# Patient Record
Sex: Male | Born: 1950 | Hispanic: Yes | Marital: Single | Smoking: Current some day smoker
Health system: Southern US, Community
[De-identification: ages and names within clinical notes are randomized; demographics above are authoritative.]

## PROBLEM LIST (undated history)

## (undated) DIAGNOSIS — K219 Gastro-esophageal reflux disease without esophagitis: Secondary | ICD-10-CM

## (undated) DIAGNOSIS — M1990 Unspecified osteoarthritis, unspecified site: Secondary | ICD-10-CM

## (undated) HISTORY — PX: UMBILICAL HERNIA REPAIR: SHX196

---

## 1997-08-16 ENCOUNTER — Ambulatory Visit (HOSPITAL_COMMUNITY): Admission: RE | Admit: 1997-08-16 | Discharge: 1997-08-16 | Payer: Self-pay | Admitting: Gastroenterology

## 1997-08-28 ENCOUNTER — Ambulatory Visit (HOSPITAL_BASED_OUTPATIENT_CLINIC_OR_DEPARTMENT_OTHER): Admission: RE | Admit: 1997-08-28 | Discharge: 1997-08-28 | Payer: Self-pay | Admitting: General Surgery

## 2009-11-04 ENCOUNTER — Emergency Department (HOSPITAL_COMMUNITY): Admission: EM | Admit: 2009-11-04 | Discharge: 2009-11-04 | Payer: Self-pay | Admitting: Emergency Medicine

## 2010-04-25 LAB — URINE MICROSCOPIC-ADD ON

## 2010-04-25 LAB — URINALYSIS, ROUTINE W REFLEX MICROSCOPIC
Ketones, ur: NEGATIVE mg/dL
Leukocytes, UA: NEGATIVE
Nitrite: NEGATIVE
Specific Gravity, Urine: 1.019 (ref 1.005–1.030)
pH: 5.5 (ref 5.0–8.0)

## 2011-03-07 ENCOUNTER — Encounter: Payer: Self-pay | Admitting: Medical

## 2011-03-08 LAB — POCT CBC

## 2011-03-11 ENCOUNTER — Ambulatory Visit (INDEPENDENT_AMBULATORY_CARE_PROVIDER_SITE_OTHER): Payer: Managed Care, Other (non HMO) | Admitting: Medical

## 2011-03-11 ENCOUNTER — Encounter: Payer: Self-pay | Admitting: Medical

## 2011-03-11 VITALS — BP 130/90 | HR 72 | Temp 98.2°F | Resp 16 | Ht 63.5 in | Wt 161.0 lb

## 2011-03-11 DIAGNOSIS — R030 Elevated blood-pressure reading, without diagnosis of hypertension: Secondary | ICD-10-CM | POA: Insufficient documentation

## 2011-03-11 DIAGNOSIS — Z0000 Encounter for general adult medical examination without abnormal findings: Secondary | ICD-10-CM

## 2011-03-11 NOTE — Patient Instructions (Signed)
I saw you for a physical today.   Recommendations: Stop smoking Exercise regularly. Consider having a colonoscopy for cancer screening.  When you are ready for this let me know and we'll set this up. Please get me copies of your recent lab work.     Preventative Care for Adults, Male       REGULAR HEALTH EXAMS:  A routine yearly physical is a good way to check in with your primary care provider about your health and preventive screening. It is also an opportunity to share updates about your health and any concerns you have, and receive a thorough all-over exam.   Most health insurance companies pay for at least some preventative services.  Check with your health plan for specific coverages.  WHAT PREVENTATIVE SERVICES DO MEN NEED?  Adult men should have their weight and blood pressure checked regularly.   Men age 60 and older should have their cholesterol levels checked regularly.  Beginning at age 60 and continuing to age 90, men should be screened for colorectal cancer.  Certain people should may need continued testing until age 16.  Other cancer screening may include exams for testicular and prostate cancer.  Updating vaccinations is part of preventative care.  Vaccinations help protect against diseases such as the flu.  Lab tests are generally done as part of preventative care to screen for anemia and blood disorders, to screen for problems with the kidneys and liver, to screen for bladder problems, to check blood sugar, and to check your cholesterol level.  Preventative services generally include counseling about diet, exercise, avoiding tobacco, drugs, excessive alcohol consumption, and sexually transmitted infections.    GENERAL RECOMMENDATIONS FOR GOOD HEALTH:  Healthy diet:  Eat a variety of foods, including fruit, vegetables, animal or vegetable protein, such as meat, fish, chicken, and eggs, or beans, lentils, tofu, and grains, such as rice.  Drink plenty of water  daily.  Decrease saturated fat in the diet, avoid lots of red meat, processed foods, sweets, fast foods, and fried foods.  Exercise:  Aerobic exercise helps maintain good heart health. At least 30-40 minutes of moderate-intensity exercise is recommended. For example, a brisk walk that increases your heart rate and breathing. This should be done on most days of the week.   Find a type of exercise or a variety of exercises that you enjoy so that it becomes a part of your daily life.  Examples are running, walking, swimming, water aerobics, and biking.  For motivation and support, explore group exercise such as aerobic class, spin class, Zumba, Yoga,or  martial arts, etc.    Set exercise goals for yourself, such as a certain weight goal, walk or run in a race such as a 5k walk/run.  Speak to your primary care provider about exercise goals.  Disease prevention:  If you smoke or chew tobacco, find out from your caregiver how to quit. It can literally save your life, no matter how long you have been a tobacco user. If you do not use tobacco, never begin.   Maintain a healthy diet and normal weight. Increased weight leads to problems with blood pressure and diabetes.   The Body Mass Index or BMI is a way of measuring how much of your body is fat. Having a BMI above 27 increases the risk of heart disease, diabetes, hypertension, stroke and other problems related to obesity. Your caregiver can help determine your BMI and based on it develop an exercise and dietary program to help  you achieve or maintain this important measurement at a healthful level.  High blood pressure causes heart and blood vessel problems.  Persistent high blood pressure should be treated with medicine if weight loss and exercise do not work.   Fat and cholesterol leaves deposits in your arteries that can block them. This causes heart disease and vessel disease elsewhere in your body.  If your cholesterol is found to be high, or if  you have heart disease or certain other medical conditions, then you may need to have your cholesterol monitored frequently and be treated with medication.   Ask if you should have a stress test if your history suggests this. A stress test is a test done on a treadmill that looks for heart disease. This test can find disease prior to there being a problem.  Avoid drinking alcohol in excess (more than two drinks per day).  Avoid use of street drugs. Do not share needles with anyone. Ask for professional help if you need assistance or instructions on stopping the use of alcohol, cigarettes, and/or drugs.  Brush your teeth twice a day with fluoride toothpaste, and floss once a day. Good oral hygiene prevents tooth decay and gum disease. The problems can be painful, unattractive, and can cause other health problems. Visit your dentist for a routine oral and dental check up and preventive care every 6-12 months.   Look at your skin regularly.  Use a mirror to look at your back. Notify your caregivers of changes in moles, especially if there are changes in shapes, colors, a size larger than a pencil eraser, an irregular border, or development of new moles.  Safety:  Use seatbelts 100% of the time, whether driving or as a passenger.  Use safety devices such as hearing protection if you work in environments with loud noise or significant background noise.  Use safety glasses when doing any work that could send debris in to the eyes.  Use a helmet if you ride a bike or motorcycle.  Use appropriate safety gear for contact sports.  Talk to your caregiver about gun safety.  Use sunscreen with a SPF (or skin protection factor) of 15 or greater.  Lighter skinned people are at a greater risk of skin cancer. Don't forget to also wear sunglasses in order to protect your eyes from too much damaging sunlight. Damaging sunlight can accelerate cataract formation.   Practice safe sex. Use condoms. Condoms are used for  birth control and to help reduce the spread of sexually transmitted infections (or STIs).  Some of the STIs are gonorrhea (the clap), chlamydia, syphilis, trichomonas, herpes, HPV (human papilloma virus) and HIV (human immunodeficiency virus) which causes AIDS. The herpes, HIV and HPV are viral illnesses that have no cure. These can result in disability, cancer and death.   Keep carbon monoxide and smoke detectors in your home functioning at all times. Change the batteries every 6 months or use a model that plugs into the wall.   Vaccinations:  Stay up to date with your tetanus shots and other required immunizations. You should have a booster for tetanus every 10 years. Be sure to get your flu shot every year, since 5%-20% of the [place] population comes down with the flu. The flu vaccine changes each year, so being vaccinated once is not enough. Get your shot in the fall, before the flu season peaks.   Other vaccines to consider:  Pneumococcal vaccine to protect against certain types of pneumonia.  This is  normally recommended for adults age 53 or older.  However, adults younger than 60 years old with certain underlying conditions such as diabetes, heart or lung disease should also receive the vaccine.  Shingles vaccine to protect against Varicella Zoster if you are older than age 105, or younger than 60 years old with certain underlying illness.  Hepatitis A vaccine to protect against a form of infection of the liver by a virus acquired from food.  Hepatitis B vaccine to protect against a form of infection of the liver by a virus acquired from blood or body fluids, particularly if you work in health care.  If you plan to travel internationally, check with your local health department for specific vaccination recommendations.  Cancer Screening:  Most routine colon cancer screening begins at the age of 63. On a yearly basis, doctors may provide special easy to use take-home tests to check for hidden  blood in the stool. Sigmoidoscopy or colonoscopy can detect the earliest forms of colon cancer and is life saving. These tests use a small camera at the end of a tube to directly examine the colon. Speak to your caregiver about this at age 11, when routine screening begins (and is repeated every 5 years unless early forms of pre-cancerous polyps or small growths are found).   At the age of 8 men usually start screening for prostate cancer every year. Screening may begin at a younger age for those with higher risk. Those at higher risk include African-Americans or having a family history of prostate cancer. There are two types of tests for prostate cancer:   Prostate-specific antigen (PSA) testing. Recent studies raise questions about prostate cancer using PSA and you should discuss this with your caregiver.   Digital rectal exam (in which your doctor's lubricated and gloved finger feels for enlargement of the prostate through the anus).   Screening for testicular cancer.  Do a monthly exam of your testicles. Gently roll each testicle between your thumb and fingers, feeling for any abnormal lumps. The best time to do this is after a hot shower or bath when the tissues are looser. Notify your caregivers of any lumps, tenderness or changes in size or shape immediately.

## 2011-03-11 NOTE — Progress Notes (Signed)
Subjective:   HPI  Jose Howell is a 61 y.o. male who presents for a complete physical.  He is a new patient today.  Originally from [place].  He hasn't had a primary doctor.  He is here for employer to have physical for insurance purposes.  He has no specific c/o.   He notes colonoscopy 15 years ago normal.  He does smoke some.  He use to smoke and drink more.   Otherwise been in usual state of health. Reviewed their medical, surgical, family, social, medication, and allergy history and updated chart as appropriate.   History reviewed. No pertinent past medical history.  Past Surgical History  Procedure Date  . Umbilical hernia repair     Family History  Problem Relation Age of Onset  . Cancer Neg Hx   . Diabetes Neg Hx   . Heart disease Neg Hx   . Hyperlipidemia Neg Hx   . Hypertension Neg Hx   . Stroke Neg Hx     History   Social History  . Marital Status: Single    Spouse Name: N/A    Number of Children: N/A  . Years of Education: N/A   Occupational History  . Financial controller   Social History Main Topics  . Smoking status: Current Some Day Smoker    Types: Cigarettes  . Smokeless tobacco: Not on file  . Alcohol Use: 0.5 oz/week    1 drink(s) per week  . Drug Use: No  . Sexually Active: Not on file   Other Topics Concern  . Not on file   Social History Narrative   From [place] originally, works as Artist at [employer], single, no children, exercise on the job    No current outpatient prescriptions on file prior to visit.    No Known Allergies  Review of Systems Constitutional: -fever, -chills, -sweats, -unexpected weight change, -anorexia, -fatigue Allergy: -sneezing, -itching, -congestion Dermatology: denies changing moles, rash, lumps, new worrisome lesions ENT: -runny nose, -ear pain, -sore throat, -hoarseness, -sinus pain, -teeth pain, -tinnitus, -hearing loss, -epistaxis Cardiology:  -chest pain, -palpitations, -edema,  -orthopnea, -paroxysmal nocturnal dyspnea Respiratory: -cough, -shortness of breath, -dyspnea on exertion, -wheezing, -hemoptysis Gastroenterology: -abdominal pain, -nausea, -vomiting, -diarrhea, -constipation, -blood in stool, -changes in bowel movement, -dysphagia Hematology: -bleeding or bruising problems Musculoskeletal: -arthralgias, -myalgias, -joint swelling, -back pain, -neck pain, -cramping, -gait changes Ophthalmology: -vision changes, -eye redness, -itching, -discharge Urology: -dysuria, -difficulty urinating, -hematuria, -urinary frequency, -urgency, incontinence Neurology: -headache, -weakness, -tingling, -numbness, -speech abnormality, -memory loss, -falls, -dizziness Psychology:  -depressed mood, -agitation, -sleep problems     Objective:   Physical Exam  Filed Vitals:   03/11/11 1544  BP: 130/90  Pulse: 72  Temp: 98.2 F (36.8 C)  Resp: 16    General appearance: alert, no distress, WD/WN, male Skin: no obvious worrisome lesions HEENT: normocephalic, conjunctiva/corneas normal, sclerae anicteric, PERRLA, EOMi, nares patent, no discharge or erythema, pharynx normal Oral cavity: MMM, tongue normal, teeth in good repair Neck: supple, no lymphadenopathy, no thyromegaly, no masses, normal ROM, no bruits Chest: non tender, normal shape and expansion Heart: RRR, normal S1, S2, no murmurs Lungs: CTA bilaterally, no wheezes, rhonchi, or rales Abdomen: +bs, soft, non tender, non distended, no masses, no hepatomegaly, no splenomegaly, no bruits Back: non tender, normal ROM, no scoliosis Musculoskeletal: upper extremities non tender, no obvious deformity, normal ROM throughout, lower extremities non tender, no obvious deformity, normal ROM throughout Extremities: no edema, no cyanosis, no clubbing Pulses: 2+  symmetric, upper and lower extremities, normal cap refill Neurological: alert, oriented x 3, CN2-12 intact, strength normal upper extremities and lower extremities,  sensation normal throughout, DTRs 2+ throughout, no cerebellar signs, gait normal Psychiatric: normal affect, behavior normal, pleasant  GU: normal male external genitalia, nontender, no masses, no hernia, no lymphadenopathy Rectal: anus normal appearing, no obvious lesions or hemorrhoids, prostate WNL   Assessment and Plan :    Encounter Diagnoses  Name Primary?  . Routine general medical examination at a health care facility Yes  . Elevated blood-pressure reading without diagnosis of hypertension     Physical exam - discussed healthy lifestyle, diet, exercise, preventative care, vaccinations, and addressed their concerns.    Advised he return in 67mo nurse visit for BP check.   Advised him to bring labs back from where he had labs 3 days ago through employer.    He plans to call back in 67mo for referral to colonoscopy.

## 2011-04-16 ENCOUNTER — Encounter: Payer: Self-pay | Admitting: Medical

## 2011-04-16 ENCOUNTER — Ambulatory Visit (INDEPENDENT_AMBULATORY_CARE_PROVIDER_SITE_OTHER): Payer: Managed Care, Other (non HMO) | Admitting: Medical

## 2011-04-16 VITALS — BP 122/88 | HR 100 | Temp 98.1°F | Resp 16 | Wt 163.0 lb

## 2011-04-16 DIAGNOSIS — R21 Rash and other nonspecific skin eruption: Secondary | ICD-10-CM

## 2011-04-16 DIAGNOSIS — R002 Palpitations: Secondary | ICD-10-CM

## 2011-04-16 DIAGNOSIS — R238 Other skin changes: Secondary | ICD-10-CM

## 2011-04-16 DIAGNOSIS — M25511 Pain in right shoulder: Secondary | ICD-10-CM

## 2011-04-16 DIAGNOSIS — Z1211 Encounter for screening for malignant neoplasm of colon: Secondary | ICD-10-CM

## 2011-04-16 DIAGNOSIS — M25519 Pain in unspecified shoulder: Secondary | ICD-10-CM

## 2011-04-16 DIAGNOSIS — R631 Polydipsia: Secondary | ICD-10-CM

## 2011-04-16 DIAGNOSIS — L853 Xerosis cutis: Secondary | ICD-10-CM

## 2011-04-16 LAB — POCT URINALYSIS DIPSTICK
Protein, UA: NEGATIVE
Spec Grav, UA: 1.02
Urobilinogen, UA: NEGATIVE
pH, UA: 5

## 2011-04-16 LAB — HEMOGLOBIN A1C: Hgb A1c MFr Bld: 5.7 % — ABNORMAL HIGH (ref <5.7)

## 2011-04-16 MED ORDER — CLOBETASOL PROPIONATE 0.05 % EX CREA: TOPICAL_CREAM | Freq: Two times a day (BID) | CUTANEOUS | Status: DC

## 2011-04-16 NOTE — Patient Instructions (Addendum)
Elevated triglycerides and cholesterol - you can begin the over the counter Niacin, Fish Oil and Red Yeast Rice.  Avoid lots of red meat.  We can recheck the cholesterol again in 3 months.  We will check blood work today for liver, kidney, electrolytes, and diabetes.   We will refer you to Dr. Elnoria Howard for your first screening colonoscopy to screen for colon cancer.  Use daily moisturizing lotion such as Lubriderm lotion for dry skin.  I sent a strong steroid cream to your pharmacy, Clobetasol, to help with the arm rash. If not resolving, let me know.    Shoulder pain - you likely have rotator cuff issue, some adhesive capsulitis and possible arthritis.  I would recommend you take some OTC Aleve once to twice daily, but I also would recommend referral to either physical therapy or orthopedic.  Let me know which you want to do.   We did an EKG today to look at your heart rhythm.  It looks normal.  This isn't the only way to check the heart though.  Other ways to check the heart include ultrasound and stress testing.  Your risk factors for heart disease include tobacco use, age, and elevated cholesterol.  Currently you don't have any ssymptomsworrisome for heart problems, but if you want additional testing, we could refer you for a stress test for screening purposes.

## 2011-04-16 NOTE — Progress Notes (Signed)
Subjective:   HPI  Jose Howell is a 61 y.o. male who presents for multiple concerns.  1) wants referral for colonoscopy.  He notes EGD years ago, but has never had a colonoscopy. 2) last 4-5 days with itchy red rash on his arms. No new exposures.  No prior similar rash.  Using coconut soap in general for hygiene that he has used for years.   3) he brought by labs he had done at work.  Wants additional labs to screen for diabetes.  Wants his heart checked out.   4) he had elevated lipids on labs and wants my opinion about taking OTC fish oil, red yeast rice and niacin.   5) he has chronic shoulder problems.  Parachuted in the military years ago, has had pain since.   In the past has had steroid injection in the shoulders.  Has limited ROM.  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    No other c/o.  The following portions of the patient's history were reviewed and updated as appropriate: allergies, current medications, past family history, past medical history, past social history, past surgical history and problem list.  No past medical history on file.  No Known Allergies   Review of Systems ROS reviewed and was negative other than noted in HPI or above.    Objective:   Physical Exam  General appearance: alert, no distress, WD/WN Skin: bilat upper and lower arms with erythema, some scaling, also dry skin Oral cavity: MMM, no lesions Neck: supple, no lymphadenopathy, no thyromegaly, no masses, no bruits Heart: RRR, normal S1, S2, no murmurs Lungs: CTA bilaterally, no wheezes, rhonchi, or rales MSK: nontender shoulders to palpation, but bilat ROM reduced in all planes - flexion, extension, internal and external rotation, pain with overhead motion, empty can test, mildly positive apprehension sign, negative crossover test, otherwise UE unremarkable Abdomen: +bs, soft, non tender, non distended, no masses, no hepatomegaly, no splenomegaly Pulses: 2+  symmetric, upper and lower extremities, normal cap refill Neuro: normal strength, sensation and DTRs of UE   Adult ECG Report  Indication: palpitations  Rate: 85bpm  Rhythm: normal sinus rhythm  QRS Axis: 49 degrees  PR Interval:  QRS Duration: 86ms  QTc:  Conduction Disturbances: none  Other Abnormalities: none  Patient's cardiac risk factors are: advanced age (older than 77 for men, 41 for women), dyslipidemia, male gender and smoking/ tobacco exposure.  EKG comparison: none  Narrative Interpretation: no acute changes    Assessment and Plan :     Encounter Diagnoses  Name Primary?  . Rash and nonspecific skin eruption Yes  . Palpitation   . Shoulder pain, bilateral   . Screen for colon cancer   . Polydipsia   . Dry skin    Rash - dry skin, inflammatory, begin lotion and Clobetasol short term  Palpitation - EKG today normal.  We discussed risk factors, additional screening with treadmill test if desired.  He will let me know.  Shoulder pain - consider PT vs ortho referral.  Differential includes adhesive capsulitis, RTC, and arthritis.  He will let me know about referral  Will refer to Dr. Elnoria Howard for first screening colonoscopy  Polydipsia - additional labs today.   Reviewed his recent CBC and lipid panel.  Dry skin - daily lubriderm recommended

## 2011-04-17 LAB — COMPREHENSIVE METABOLIC PANEL
ALT: 25 U/L (ref 0–53)
Albumin: 4.4 g/dL (ref 3.5–5.2)
BUN: 13 mg/dL (ref 6–23)
CO2: 21 mEq/L (ref 19–32)
Calcium: 8.9 mg/dL (ref 8.4–10.5)
Chloride: 106 mEq/L (ref 96–112)
Creat: 0.86 mg/dL (ref 0.50–1.35)
Potassium: 4.1 mEq/L (ref 3.5–5.3)

## 2011-04-17 LAB — C-REACTIVE PROTEIN: CRP: 0.2 mg/dL (ref <0.60)

## 2012-01-29 ENCOUNTER — Other Ambulatory Visit: Payer: Self-pay

## 2012-01-29 ENCOUNTER — Emergency Department (HOSPITAL_COMMUNITY): Payer: Self-pay

## 2012-01-29 ENCOUNTER — Encounter (HOSPITAL_COMMUNITY): Payer: Self-pay

## 2012-01-29 ENCOUNTER — Observation Stay (HOSPITAL_COMMUNITY)
Admission: EM | Admit: 2012-01-29 | Discharge: 2012-01-29 | Discharge status: Against Medical Advice | Payer: Self-pay | Attending: Emergency Medicine | Admitting: Emergency Medicine

## 2012-01-29 DIAGNOSIS — R51 Headache: Secondary | ICD-10-CM | POA: Insufficient documentation

## 2012-01-29 DIAGNOSIS — R0789 Other chest pain: Principal | ICD-10-CM | POA: Insufficient documentation

## 2012-01-29 DIAGNOSIS — M79609 Pain in unspecified limb: Secondary | ICD-10-CM | POA: Insufficient documentation

## 2012-01-29 DIAGNOSIS — Z79899 Other long term (current) drug therapy: Secondary | ICD-10-CM | POA: Insufficient documentation

## 2012-01-29 DIAGNOSIS — R42 Dizziness and giddiness: Secondary | ICD-10-CM | POA: Insufficient documentation

## 2012-01-29 DIAGNOSIS — F17200 Nicotine dependence, unspecified, uncomplicated: Secondary | ICD-10-CM | POA: Insufficient documentation

## 2012-01-29 DIAGNOSIS — R079 Chest pain, unspecified: Secondary | ICD-10-CM

## 2012-01-29 DIAGNOSIS — Z7982 Long term (current) use of aspirin: Secondary | ICD-10-CM | POA: Insufficient documentation

## 2012-01-29 LAB — COMPREHENSIVE METABOLIC PANEL
ALT: 21 U/L (ref 0–53)
Albumin: 4 g/dL (ref 3.5–5.2)
Alkaline Phosphatase: 61 U/L (ref 39–117)
BUN: 9 mg/dL (ref 6–23)
Chloride: 103 mEq/L (ref 96–112)
GFR calc Af Amer: >90 mL/min (ref >90)
Glucose, Bld: 109 mg/dL — ABNORMAL HIGH (ref 70–99)
Potassium: 4 mEq/L (ref 3.5–5.1)
Sodium: 139 mEq/L (ref 135–145)
Total Bilirubin: 0.3 mg/dL (ref 0.3–1.2)
Total Protein: 7.4 g/dL (ref 6.0–8.3)

## 2012-01-29 LAB — CBC WITH DIFFERENTIAL/PLATELET
Eosinophils Absolute: 0 10*3/uL (ref 0.0–0.7)
Hemoglobin: 16.1 g/dL (ref 13.0–17.0)
Lymphs Abs: 1.7 10*3/uL (ref 0.7–4.0)
Monocytes Relative: 7 % (ref 3–12)
Neutro Abs: 4 10*3/uL (ref 1.7–7.7)
Neutrophils Relative %: 65 % (ref 43–77)
Platelets: 241 10*3/uL (ref 150–400)
RBC: 4.96 MIL/uL (ref 4.22–5.81)
WBC: 6.2 10*3/uL (ref 4.0–10.5)

## 2012-01-29 LAB — POCT I-STAT TROPONIN I: Troponin i, poc: 0.01 ng/mL (ref 0.00–0.08)

## 2012-01-29 MED ORDER — NITROGLYCERIN 0.4 MG SL SUBL
0.4000 mg | SUBLINGUAL_TABLET | SUBLINGUAL | Status: DC | PRN
  Administered 2012-01-29: 0.4 mg via SUBLINGUAL

## 2012-01-29 MED ORDER — ASPIRIN 81 MG PO CHEW
324.0000 mg | CHEWABLE_TABLET | Freq: Once | ORAL | Status: AC
  Administered 2012-01-29: 324 mg via ORAL
  Filled 2012-01-29: qty 4

## 2012-01-29 NOTE — ED Provider Notes (Addendum)
History     CSN: [id number]  Arrival date & time 01/29/12  1501   First MD Initiated Contact with Patient 01/29/12 1713      Chief Complaint  Patient presents with  . Chest Pain    (Consider location/radiation/quality/duration/timing/severity/associated sxs/prior treatment) HPI  Patient complaining of left upper arm pain and left chest pain began yesterday about 2-3 minutes.  Today still had left upper arm pain then began having sharp chest pain and felt dizzy about noon today doing housework, felt sob walking in.  No similar episode.  Mild headache, no cough, nausea, vomiting or diarrhea.  Patient is smoker, some question of hyperlipidemia, no family h.o. Of cad.  Patient states no chest pain at present but left upper arm pain at 7-8 /10.    History reviewed. No pertinent past medical history.  Past Surgical History  Procedure Date  . Umbilical hernia repair     Family History  Problem Relation Age of Onset  . Cancer Neg Hx   . Diabetes Neg Hx   . Heart disease Neg Hx   . Hyperlipidemia Neg Hx   . Hypertension Neg Hx   . Stroke Neg Hx     History  Substance Use Topics  . Smoking status: Current Some Day Smoker    Types: Cigarettes  . Smokeless tobacco: Not on file  . Alcohol Use: 0.5 oz/week    1 drink(s) per week      Review of Systems  Constitutional: Negative for fever and chills.  HENT: Negative for neck stiffness.   Eyes: Negative for visual disturbance.  Respiratory: Negative for shortness of breath.   Cardiovascular: Negative for chest pain.  Gastrointestinal: Negative for vomiting, diarrhea and blood in stool.  Genitourinary: Negative for dysuria, frequency and decreased urine volume.  Musculoskeletal: Negative for myalgias and joint swelling.  Skin: Negative for rash.  Neurological: Negative for weakness.  Hematological: Negative for adenopathy.  Psychiatric/Behavioral: Negative for agitation.    Allergies  Review of patient's allergies  indicates no known allergies.  Home Medications   Current Outpatient Rx  Name  Route  Sig  Dispense  Refill  . ASPIRIN 325 MG PO TABS   Oral   Take 325 mg by mouth daily.         Marland Kitchen GARLIC 1000 MG PO CAPS   Oral   Take 1 capsule by mouth daily.         Marland Kitchen ONE-DAILY MULTI VITAMINS PO TABS   Oral   Take 1 tablet by mouth daily.           BP 142/98  Pulse 103  Temp 98.4 F (36.9 C) (Oral)  Resp 26  SpO2 97%  Physical Exam  Nursing note and vitals reviewed. Constitutional: He is oriented to person, place, and time. He appears well-developed and well-nourished.  HENT:  Head: Normocephalic and atraumatic.  Right Ear: External ear normal.  Left Ear: External ear normal.  Nose: Nose normal.  Mouth/Throat: Oropharynx is clear and moist.  Eyes: Conjunctivae normal and EOM are normal. Pupils are equal, round, and reactive to light.  Neck: Normal range of motion. Neck supple.  Cardiovascular: Normal rate, regular rhythm, normal heart sounds and intact distal pulses.   Pulmonary/Chest: Effort normal and breath sounds normal. No respiratory distress. He has no wheezes. He exhibits no tenderness.  Abdominal: Soft. Bowel sounds are normal. He exhibits no distension and no mass. There is no tenderness. There is no guarding.  Musculoskeletal: Normal range of  motion.  Neurological: He is alert and oriented to person, place, and time. He has normal reflexes. He exhibits normal muscle tone. Coordination normal.  Skin: Skin is warm and dry.  Psychiatric: He has a normal mood and affect. His behavior is normal. Judgment and thought content normal.    ED Course  Procedures (including critical care time)  Labs Reviewed  COMPREHENSIVE METABOLIC PANEL - Abnormal; Notable for the following:    Glucose, Bld 109 (*)     All other components within normal limits  CBC WITH DIFFERENTIAL  POCT I-STAT TROPONIN I   Dg Chest 2 View  01/29/2012  *RADIOLOGY REPORT*  Clinical Data: Left-sided  chest pain  CHEST - 2 VIEW  Comparison: 11/04/2009  Findings: Cardiomediastinal silhouette is unremarkable.  No acute infiltrate or pleural effusion.  No pulmonary edema.  Degenerative changes thoracic spine.  IMPRESSION: No active disease.   Original Report Authenticated By: Natasha Mead, M.D.      No diagnosis found.    Date: 01/29/2012  Rate: 104  Rhythm: sinus tachycardia  QRS Axis: normal  Intervals: normal  ST/T Wave abnormalities: normal  Conduction Disutrbances:none  Narrative Interpretation:   Old EKG Reviewed: none available   MDM   Patient with atypical chest pain with lue pain.  Negative work up here with no ekg changes.  Plan chest pain work up in cdu.  Discussed with Felicie Morn, np and will transfer to cdu and plan ct angio in a.m.        Jose Quarry, MD 01/29/12 1958  Jose Quarry, MD 01/29/12 2000  Specimen with patient and he states he cannot stay in hospital do to his animal care situation. I've advised the patient that he should be evaluated here tonight. He is choosing to leave AGAINST MEDICAL ADVICE. Date: 01/29/2012 Patient: Jose Howell Admitted: 01/29/2012  5:02 PM Attending Provider: Hilario Quarry, MD  Janett Billow or his authorized caregiver has made the decision for the patient to leave the emergency department against the advice of Jose Quarry, MD.  He or his authorized caregiver has been informed and understands the inherent risks, including death.  He or his authorized caregiver has decided to accept the responsibility for this decision. Jose Howell and all necessary parties have been advised that he may return for further evaluation or treatment. His condition at time of discharge was Stable.  Jose Howell had current vital signs as follows:  Blood pressure 144/71, pulse 93, temperature 98.4 F (36.9 C), temperature source Oral, resp. rate 21, SpO2 98.00%.   Jose Howell or his authorized caregiver has  signed the Leaving Against Medical Advice form prior to leaving the department.  Azriella Mattia S 01/29/2012      Jose Quarry, MD 01/29/12 2005

## 2012-01-29 NOTE — ED Notes (Signed)
Pt presents with L sided chest pain since yesterday while doing the dishes.  Pt reports pain radiates into L arm.  +shortness of breath and dizziness.

## 2012-01-29 NOTE — ED Notes (Signed)
Patient is leaving AMA after having discussed treatment and consequences of leaving without finishing his evaluation and treatment.

## 2012-01-29 NOTE — ED Notes (Signed)
Patient arrived by POV, drove himself. He states he started having chest discomfort with dizziness with some blurry vision yesterday. Today he continued to have chest discomfort with radiation to left arm. Denies diaphoresis or nausea.

## 2012-01-29 NOTE — ED Notes (Signed)
Patient denies chest discomfort at this time. Just has dizziness and left arm tingling with numbness.

## 2014-08-11 ENCOUNTER — Emergency Department (HOSPITAL_COMMUNITY)
Admission: EM | Admit: 2014-08-11 | Discharge: 2014-08-11 | Discharge status: Against Medical Advice | Payer: Non-veteran care | Attending: Emergency Medicine | Admitting: Emergency Medicine

## 2014-08-11 ENCOUNTER — Encounter (HOSPITAL_COMMUNITY): Payer: Self-pay | Admitting: Emergency Medicine

## 2014-08-11 ENCOUNTER — Emergency Department (HOSPITAL_COMMUNITY): Payer: Non-veteran care

## 2014-08-11 ENCOUNTER — Ambulatory Visit (HOSPITAL_COMMUNITY): Payer: Managed Care, Other (non HMO)

## 2014-08-11 DIAGNOSIS — R42 Dizziness and giddiness: Secondary | ICD-10-CM | POA: Diagnosis present

## 2014-08-11 DIAGNOSIS — Z79899 Other long term (current) drug therapy: Secondary | ICD-10-CM | POA: Insufficient documentation

## 2014-08-11 DIAGNOSIS — Z720 Tobacco use: Secondary | ICD-10-CM | POA: Diagnosis not present

## 2014-08-11 DIAGNOSIS — M25511 Pain in right shoulder: Secondary | ICD-10-CM | POA: Diagnosis not present

## 2014-08-11 DIAGNOSIS — Z7982 Long term (current) use of aspirin: Secondary | ICD-10-CM | POA: Diagnosis not present

## 2014-08-11 MED ORDER — LORAZEPAM 1 MG PO TABS
1.0000 mg | ORAL_TABLET | Freq: Once | ORAL | Status: AC
  Administered 2014-08-11: 1 mg via ORAL
  Filled 2014-08-11: qty 1

## 2014-08-11 MED ORDER — MECLIZINE HCL 25 MG PO TABS
25.0000 mg | ORAL_TABLET | Freq: Once | ORAL | Status: AC
  Administered 2014-08-11: 25 mg via ORAL
  Filled 2014-08-11: qty 1

## 2014-08-11 MED ORDER — MECLIZINE HCL 50 MG PO TABS: 25.0000 mg | ORAL_TABLET | Freq: Three times a day (TID) | ORAL | Status: DC | PRN

## 2014-08-11 NOTE — Discharge Instructions (Signed)
°Emergency Department Resource Guide °1) Find a Doctor and Pay Out of Pocket °Although you won't have to find out who is covered by your insurance plan, it is a good idea to ask around and get recommendations. You will then need to call the office and see if the doctor you have chosen will accept you as a new patient and what types of options they offer for patients who are self-pay. Some doctors offer discounts or will set up payment plans for their patients who do not have insurance, but you will need to ask so you aren't surprised when you get to your appointment. ° °2) Contact Your Local Health Department °Not all health departments have doctors that can see patients for sick visits, but many do, so it is worth a call to see if yours does. If you don't know where your local health department is, you can check in your phone book. The CDC also has a tool to help you locate your state's health department, and many state websites also have listings of all of their local health departments. ° °3) Find a Walk-in Clinic °If your illness is not likely to be very severe or complicated, you may want to try a walk in clinic. These are popping up all over the country in pharmacies, drugstores, and shopping centers. They're usually staffed by nurse practitioners or physician assistants that have been trained to treat common illnesses and complaints. They're usually fairly quick and inexpensive. However, if you have serious medical issues or chronic medical problems, these are probably not your best option. ° °No Primary Care Doctor: °- Call Health Connect at  832-8000 - they can help you locate a primary care doctor that  accepts your insurance, provides certain services, etc. °- Physician Referral Service- 1-800-533-3463 ° °Chronic Pain Problems: °Organization         Address  Phone   Notes  °[facility]  (336) 297-2271 Patients need to be referred by their primary care doctor.  ° °Medication  Assistance: °Organization         Address  Phone   Notes  °[place] [facility] [address] °[place], [place] [address] (336) 641-8030 --Must be a resident of [place] °-- Must have NO insurance coverage whatsoever (no Medicaid/ Medicare, etc.) °-- The pt. MUST have a primary care doctor that directs their care regularly and follows them in the community °  °[facility]  (866) 331-1348   °[employer] Way  (888) 892-1162   ° °Agencies that provide inexpensive medical care: °Organization         Address  Phone   Notes  °[facility]  (336) 832-8035   °[facility]    (336) 832-7272   °[facility] [address] °[place], [place] [address] (336) 832-4777   °[facility] [address], °[place] (336) 271-4999   °[facility]    (336) 373-0678   °[facility]    (336) 272-1050   °[facility] ° [address], [place] Phone:  (336) 832-4444, Fax:  (336) 832-4440 Hours of Operation:  9 am - 6 pm, M-F.  Also accepts Medicaid/Medicare and self-pay.  °[facility] ° [address], [place] Phone: (336) 832-3150, Fax: (336) [phone]. Hours of Operation:  8:30 am - 5:30 pm, M-F.  Also accepts Medicaid and self-pay.  °[facility] [place] [address], [place] Phone: (336) 878-6027   °[facility] [address], [place], [place] (336)723-1848, Ext. 123 Mondays & Thursdays: 7-9 AM.  First 15 patients are seen on a first come, first serve basis. °  ° °Medicaid-accepting [place] Providers: ° °Organization         Address  Phone   Notes  °[facility] [address], [place] (336) 641-2100 Also accepts self-pay patients.  °[facility] [address], [place] ° (336) 856-9996   °[facility] [address], [place]  (336) 288-8857   °[facility] [address] [place] [address], [place] (336) 299-7000   °Veita Bland [address], [place]  ° (336) 373-1557 Only accepts [place] Access Medicaid patients after they have their name applied to their card.  ° °Self-Pay (no insurance) in [place]: ° °Organization         Address  Phone   Notes  °Sickle Cell Patients, [facility] [address], [place] (336) 832-1970   °[facility] [address], [place] (336) 832-4400   °[facility] ° [address], [place] (336) 992-4800   °[facility]/Dr. Osei-Bonsu ° [address] [place] [address], [place] or [address], [place] (336) 841-8500 Phone number for both [place] and [place] locations is the same.  °[facility] [address], [place] (336) 299-0000   °[facility] [address] [place] [address], [place] or [address] (336) 852-7530 °(336) 878-2260   °[facility] [address], [place] (336) 350-1642, phone; (336) 294-5005, fax Sees patients 1st and 3rd Saturday of every month.  Must not qualify for public or private insurance (i.e. Medicaid, Medicare, [place] Health Choice, Veterans' Benefits) • Household income should be no more than 200% of the poverty level •The clinic cannot treat you if you are pregnant or think you are pregnant • Sexually transmitted diseases are not treated at the clinic.  ° ° °Dental Care: °Organization         Address  Phone  Notes  °[place] [facility] [address], [place] (336) 641-6152 Accepts children up to age 21 who are enrolled in Medicaid or [place] Health Choice; pregnant women with a Medicaid card; and children who have applied for Medicaid or [place] Health Choice, but were declined, whose parents can pay a reduced fee at time of service.  °[place]  [facility] [place]  [address], [place] (336) 641-7733 Accepts children up to age 21 who are enrolled in Medicaid or [place] Health Choice; pregnant women with a Medicaid card; and children who have applied for Medicaid or [place] Health Choice, but were declined, whose parents can pay a reduced fee at time of service.  °[facility] ° [address], [place] (336) 641-4533 Patients are seen by appointment only. Walk-ins are not accepted. [facility] will see patients 18 years of age and older. °Monday - Tuesday (8am-5pm) °Most Wednesdays (8:30-5pm) °$30 per visit, cash only  °[facility] ° [address], [place] (336) 641-4533 Patients are seen by appointment only. Walk-ins are not accepted. [facility] will see patients 18 years of age and older. °One   Wednesday Evening (Monthly: Volunteer Based).  $30 per visit, cash only  °[facility]  (919) 537-3737 for adults; Children under age 4, call Graduate Pediatric Dentistry at ([phone]) [phone]. Children aged 4-14, please call (919) [phone] to request a pediatric application. ° Dental services are provided in all areas of dental care including fillings, crowns and bridges, complete and partial dentures, implants, gum treatment, root canals, and extractions. Preventive care is also provided. Treatment is provided to both adults and children. °Patients are selected via a lottery and there is often a waiting list. °  °[facility] [address], °[place] ° (336) 763-8833 [email or web address] °  °[facility] [address], [place], [place] (336)723-1848, Ext. 123 Second and Fourth Thursday of each month, opens at 6:30 AM; Clinic ends at 9 AM.  Patients are seen on a first-come first-served basis, and a limited number are seen during each clinic.  ° °[facility] ° [address], [place], [place] (336) 723-7904    Eligibility Requirements °You must have lived in [place], [place], or [place] counties for at least the last three months. °  You cannot be eligible for state or federal sponsored healthcare insurance, including Veterans Administration, Medicaid, or Medicare. °  You generally cannot be eligible for healthcare insurance through your employer.  °  How to apply: °Eligibility screenings are held every Tuesday and Wednesday afternoon from 1:00 pm until 4:00 pm. You do not need an appointment for the interview!  °[facility] [address], [place], [place] 336-631-2330   °[facility]  336-342-8273   °[facility]  336-703-3100   °[facility]  336-570-6415   ° °Behavioral Health Resources in the Community: °Intensive Outpatient Programs °Organization         Address  Phone  Notes  °[facility] [address], [place], [place] 336-878-6098   °[facility] [address], [place], [place] 336-832-9800   °[facility] [address], [place], [place] ° 336-882-2125   °[facility] [address],  °[place], [place] 1-800-853-5163 or 336-641-4981   °Substance Abuse Resources °Organization         Address  Phone  Notes  °[facility]  336-882-2125   °[facility]  336-784-9470   °[facility]  336-285-9073   °[facility]  336-845-3988   °Residential & Outpatient Substance Abuse Program  1-800-659-3381   °Psychological Services °Organization         Address  Phone  Notes  °[facility]  336- 832-9600   °[facility]  336- 378-7881   °[facility] [address], [place] 1-800-853-5163 or 336-641-4981   ° °Mobile Crisis Teams °Organization         Address  Phone  Notes  °[facility], Mobile Crisis Care Unit  1-877-626-1772   °Assertive °[facility] ° [address]  [place], [place] 336-834-9664   °Sharon DeEsch [address] °[place] 336-554-5454   ° °Self-Help/Support Groups °Organization         Address  Phone             Notes  °[facility] of  - variety of support groups  336- 373-1402 Call for more information  °Narcotics Anonymous (NA), [facility] [address], °[place]  2 meetings at this location  ° °  Residential Treatment Programs Organization         Address  Phone  Notes  [facility] [address],    [place]  [phone]   [facility]  [address], [place] [phone], [place], [place] [phone]   [facility] [address], [place] [phone]) [phone] Admissions: 8am-3pm M-F  [facility] [address]    [place], [place] 324-401-0272([phone])[phone]   [facility] [address], [place], [place] [phone]   [facility] [address]  [place], [place] [phone]) [phone]   [facility] Intensive Outpatient [address], [place], [place] [phone]   [facility] ([facility] [address]  [place], [place] [phone] or [phone]   [facility] ([facility] [address] [place], [place] [phone] Accepts Medicaid  [facility] [address]  [place] [phone] Substance Abuse/Addiction Treatment   [facility] Organization         Address  Phone  Notes  [facility]  660-247-5531(888) 317-031-5589   Angie FavaJulie Brannon, PhD [address], [address] [place], [place]   [phone](336) [phone] or (904)[phone](336) ([phone]   [facility]   [address] [place], [place] [phone](336) [phone]   [facility] [address], [place], [place] [phone](336) ([phone] Insurance/Medicaid/sponsorship through [facility]  [facility] [address]                                    [place], [place] [phone](336) [phone] Therapy/tele-psych/case    [facility] [address]   [place], [place] [phone](336) [phone]    Dr. Lolly MustacheArfeen  [phone](336) [phone]   [facility]  [employer] [facility] 1) [address], [place] 2) [address], [place] 3)  [address], [place] [phone](336) [phone](336) [phone]  [phone](336) [phone]   [facility] Child Abuse Hotline (806)329-5669(336) 518-109-0692 or 212-307-2262(336) [phone] (After Hours)      Take the prescription as directed.  Call your regular medical doctor on Monday to schedule a follow up appointment within the next 3 days or you change your mind regarding having the MRI.  Return to the Emergency Department immediately sooner if worsening.

## 2014-08-11 NOTE — ED Notes (Signed)
Patient transported to MRI 

## 2014-08-11 NOTE — ED Notes (Signed)
Pt A&Ox4. Appears anxious at this time. Recently lost his mother and sister within a 6 month span. Denies chest pain but does report SOB. Also recently felt his right upper arm "snap" a few months ago. [facility] have not [facility] yet. Says he stood up today and anytime he sits up he feels extreme dizziness. Slightly diaphoretic. Speaking full/clear sentences. RR even. No other c/c.

## 2014-08-11 NOTE — ED Notes (Signed)
Pt reports dizziness that started this am upon awakening. Pt reports he has fallen 2x and fell on R shoulder which hurts. Denies chest pain. Pt hyperventilating and appears anxious in triage, O2 sat 100% on RA, encouraged pt to slow breathing.

## 2014-08-11 NOTE — ED Provider Notes (Signed)
Pt refuses MRI. Given ativan; continues to refuse. Pt now refusing anything else and wants to go home right now.  Pt and family informed re: dx testing results, including need for testing to r/o stroke as cause for his symptoms, that I recommend staying for further evaluation.  Pt refuses.  I encouraged pt to stay, continues to refuse.  Pt makes his own medical decisions.  Risks of AMA explained to pt and family, including, but not limited to:  stroke, heart attack, cardiac arrythmia ("irregular heart rate/beat"), "passing out," temporary and/or permanent disability, death.  Pt and family verb understanding and continue to refuse admission, understanding the consequences of their decision.  I encouraged pt to follow up with his PMD on Monday and return to the ED immediately if symptoms return, worsen, he changes his mind, or for any other concerns.  Pt and family verb understanding, agreeable.    Samuel JesterKathleen Levada Bow[place], DO 08/11/14 [phone]

## 2014-08-11 NOTE — ED Provider Notes (Signed)
CSN: [id number]     Arrival date & time 08/11/14  1320 History   First MD Initiated Contact with Patient 08/11/14 1415     Chief Complaint  Patient presents with  . Dizziness     (Consider location/radiation/quality/duration/timing/severity/associated sxs/prior Treatment) HPI Complains of dizziness i.e. sensation of room spinning onset upon awakening 8:30 AM today. Dizziness is exacerbated by changing position improved by remaining still. He admits to questionable diplopia time. His girlfriend who accompanies him states his speech is basically normal however might be slightly slurred compared to baseline. Patient is fluent in [place] but has a [place] accent. No focal numbness or weakness no other associated symptoms no hearing deficit no associated nausea or vomiting. History reviewed. No pertinent past medical history. Past Surgical History  Procedure Laterality Date  . Umbilical hernia repair     Family History  Problem Relation Age of Onset  . Cancer Neg Hx   . Diabetes Neg Hx   . Heart disease Neg Hx   . Hyperlipidemia Neg Hx   . Hypertension Neg Hx   . Stroke Neg Hx    History  Substance Use Topics  . Smoking status: Current Some Day Smoker    Types: Cigarettes  . Smokeless tobacco: Not on file  . Alcohol Use: 0.5 oz/week    1 drink(s) per week    Review of Systems  Constitutional: Negative.   HENT: Negative.   Respiratory: Negative.   Cardiovascular: Negative.   Gastrointestinal: Negative.   Musculoskeletal: Positive for arthralgias.       Right shoulder pain and limited range of motion right shoulder for several months  Skin: Negative.   Neurological: Positive for dizziness.       Vertigo  Psychiatric/Behavioral: Negative.   All other systems reviewed and are negative.     Allergies  Review of patient's allergies indicates no known allergies.  Home Medications   Prior to Admission medications   Medication Sig Start Date End Date Taking? Authorizing  Provider  aspirin 81 MG tablet Take 81 mg by mouth daily.   Yes Historical Provider, MD  Garlic 1000 MG CAPS Take 1 capsule by mouth daily.   Yes Historical Provider, MD  Multiple Vitamin (MULTIVITAMIN) tablet Take 1 tablet by mouth daily.   Yes Historical Provider, MD  oxyCODONE-acetaminophen (PERCOCET/ROXICET) 5-325 MG per tablet Take 1 tablet by mouth every 4 (four) hours as needed for moderate pain or severe pain.   Yes Historical Provider, MD  vitamin C (ASCORBIC ACID) 500 MG tablet Take 500 mg by mouth daily.   Yes Historical Provider, MD   BP 139/91 mmHg  Pulse 107  Temp(Src) 98.2 F (36.8 C) (Oral)  Resp 24  SpO2 100% Physical Exam  Constitutional: He is oriented to person, place, and time. He appears well-developed and well-nourished.  HENT:  Head: Normocephalic and atraumatic.  Bilateral tympanic membranes normal  Eyes: Conjunctivae are normal. Pupils are equal, round, and reactive to light.  Neck: Neck supple. No tracheal deviation present. No thyromegaly present.  Cardiovascular: Normal rate and regular rhythm.   No murmur heard. Pulmonary/Chest: Effort normal and breath sounds normal.  Abdominal: Soft. Bowel sounds are normal. He exhibits no distension. There is no tenderness.  Musculoskeletal: Normal range of motion. He exhibits no edema or tenderness.  Limited range of right shoulder otherwise all 4 extremities without deformity or swelling, neurovascularly intact  Neurological: He is alert and oriented to person, place, and time. He has normal reflexes. Coordination normal.  DTRs  symmetric bilaterally at knee jerk ankle jerk and biceps toes downward going bilaterally finger to nose normal gait normal  Skin: Skin is warm and dry. No rash noted.  Psychiatric: He has a normal mood and affect.  Nursing note and vitals reviewed.   ED Course  Procedures (including critical care time) Labs Review Labs Reviewed - No data to display  Imaging Review No results found.    EKG Interpretation None     ED ECG REPORT   Date: 08/11/2014  Rate: 105  Rhythm: sinus tachycardia  QRS Axis: normal  Intervals: normal  ST/T Wave abnormalities: normal  Conduction Disutrbances:none  Narrative Interpretation:   Old EKG Reviewed: unchanged  I have personally reviewed the EKG tracing and agree with the computerized printout as noted. Pt signed ot to Dr. Richrd PrimeMcmannus at 445pm MDM  MRI scan of the brain ordered in light of questionable speech changes, vertigo and questionable visual changes in this elderly smoker check for posterior circulation stroke Final diagnoses:  None    Dx  Vertigo    Doug SouSam Cutler Sunday, MD 08/11/14 1649

## 2014-09-19 ENCOUNTER — Other Ambulatory Visit (HOSPITAL_COMMUNITY): Payer: Self-pay | Admitting: Unknown Physician Specialty

## 2014-09-19 DIAGNOSIS — M25511 Pain in right shoulder: Secondary | ICD-10-CM

## 2014-09-26 ENCOUNTER — Ambulatory Visit (HOSPITAL_COMMUNITY): Admission: RE | Admit: 2014-09-26 | Payer: Non-veteran care | Source: Ambulatory Visit

## 2014-10-04 ENCOUNTER — Other Ambulatory Visit (HOSPITAL_COMMUNITY): Payer: Self-pay | Admitting: Unknown Physician Specialty

## 2014-10-04 DIAGNOSIS — M25511 Pain in right shoulder: Secondary | ICD-10-CM

## 2014-10-12 ENCOUNTER — Encounter (HOSPITAL_COMMUNITY)
Admission: RE | Admit: 2014-10-12 | Discharge: 2014-10-12 | Disposition: A | Discharge status: Home / Self Care | Payer: No Typology Code available for payment source | Source: Ambulatory Visit | Attending: Unknown Physician Specialty | Admitting: Unknown Physician Specialty

## 2014-10-12 ENCOUNTER — Encounter (HOSPITAL_COMMUNITY): Payer: Self-pay

## 2014-10-12 DIAGNOSIS — Z01812 Encounter for preprocedural laboratory examination: Secondary | ICD-10-CM | POA: Insufficient documentation

## 2014-10-12 DIAGNOSIS — M25511 Pain in right shoulder: Secondary | ICD-10-CM | POA: Diagnosis not present

## 2014-10-12 HISTORY — DX: Unspecified osteoarthritis, unspecified site: M19.90

## 2014-10-12 HISTORY — DX: Gastro-esophageal reflux disease without esophagitis: K21.9

## 2014-10-12 LAB — CBC
HCT: 47.6 % (ref 39.0–52.0)
HEMOGLOBIN: 16 g/dL (ref 13.0–17.0)
MCH: 32.3 pg (ref 26.0–34.0)
MCHC: 33.6 g/dL (ref 30.0–36.0)
MCV: 96 fL (ref 78.0–100.0)
PLATELETS: 230 10*3/uL (ref 150–400)
RBC: 4.96 MIL/uL (ref 4.22–5.81)
RDW: 14.8 % (ref 11.5–15.5)
WBC: 6.6 10*3/uL (ref 4.0–10.5)

## 2014-10-12 NOTE — Pre-Procedure Instructions (Signed)
    WHITFIELD DULAY  10/12/2014      [facility] - [place], [place] - [address] [place] [address] Phone: [phone] Fax: [phone]    Your procedure is scheduled on 10-17-2014   Report to [facility] Admitting at 6:00 A.M.   Call this number if you have problems the morning of surgery:  [phone]   Remember:  Do not eat food or drink liquids after midnight.   Take these medicines the morning of surgery with A SIP OF WATER omeprazole(prilosec)   Do not wear jewelry  Do not wear lotions, powders,     Do not shave 48 hours prior to surgery.  Men may shave face and neck.   Do not bring valuables to the hospital.  [facility] is not responsible for any belongings or valuables.  Contacts, dentures or bridgework may not be worn into surgery.      Patients discharged the day of surgery will not be allowed to drive home.      Please read over the following fact sheets that you were given. Pain Booklet

## 2014-10-16 NOTE — Anesthesia Preprocedure Evaluation (Addendum)
Anesthesia Evaluation  Patient identified by MRN, date of birth, ID band Patient awake    Reviewed: Allergy & Precautions, NPO status , Patient's Chart, lab work & pertinent test results  History of Anesthesia Complications Negative for: history of anesthetic complications  Airway Mallampati: III  TM Distance: >3 FB Neck ROM: Full    Dental no notable dental hx. (+) Dental Advisory Given   Pulmonary Current Smoker,  breath sounds clear to auscultation  Pulmonary exam normal       Cardiovascular negative cardio ROS Normal cardiovascular examRhythm:Regular Rate:Normal     Neuro/Psych negative neurological ROS  negative psych ROS   GI/Hepatic Neg liver ROS, GERD-  Medicated and Controlled,  Endo/Other  negative endocrine ROS  Renal/GU negative Renal ROS  negative genitourinary   Musculoskeletal  (+) Arthritis -,   Abdominal   Peds negative pediatric ROS (+)  Hematology negative hematology ROS (+)   Anesthesia Other Findings   Reproductive/Obstetrics negative OB ROS                           Anesthesia Physical Anesthesia Plan  ASA: II  Anesthesia Plan: General   Post-op Pain Management:    Induction: Intravenous  Airway Management Planned: LMA  Additional Equipment:   Intra-op Plan:   Post-operative Plan: Extubation in OR  Informed Consent: I have reviewed the patients History and Physical, chart, labs and discussed the procedure including the risks, benefits and alternatives for the proposed anesthesia with the patient or authorized representative who has indicated his/her understanding and acceptance.   Dental advisory given  Plan Discussed with:   Anesthesia Plan Comments:         Anesthesia Quick Evaluation

## 2014-10-17 ENCOUNTER — Encounter (HOSPITAL_COMMUNITY): Payer: Self-pay | Admitting: *Deleted

## 2014-10-17 ENCOUNTER — Ambulatory Visit (HOSPITAL_COMMUNITY)
Admission: RE | Admit: 2014-10-17 | Discharge: 2014-10-17 | Disposition: A | Discharge status: Home / Self Care | Payer: Non-veteran care | Source: Ambulatory Visit | Attending: Unknown Physician Specialty | Admitting: Unknown Physician Specialty

## 2014-10-17 ENCOUNTER — Ambulatory Visit (HOSPITAL_COMMUNITY): Payer: Non-veteran care | Admitting: Anesthesiology

## 2014-10-17 ENCOUNTER — Encounter (HOSPITAL_COMMUNITY)
Admission: RE | Disposition: A | Discharge status: Home / Self Care | Payer: Self-pay | Source: Ambulatory Visit | Attending: Unknown Physician Specialty

## 2014-10-17 DIAGNOSIS — X58XXXA Exposure to other specified factors, initial encounter: Secondary | ICD-10-CM | POA: Diagnosis not present

## 2014-10-17 DIAGNOSIS — S46811A Strain of other muscles, fascia and tendons at shoulder and upper arm level, right arm, initial encounter: Secondary | ICD-10-CM | POA: Insufficient documentation

## 2014-10-17 DIAGNOSIS — M25511 Pain in right shoulder: Secondary | ICD-10-CM

## 2014-10-17 HISTORY — PX: RADIOLOGY WITH ANESTHESIA: SHX6223

## 2014-10-17 SURGERY — RADIOLOGY WITH ANESTHESIA
Anesthesia: General | Site: Shoulder | Laterality: Right

## 2014-10-17 MED ORDER — ONDANSETRON HCL 4 MG/2ML IJ SOLN: 4.0000 mg | Freq: Once | INTRAMUSCULAR | Status: DC | PRN

## 2014-10-17 MED ORDER — FENTANYL CITRATE (PF) 100 MCG/2ML IJ SOLN: 25.0000 ug | INTRAMUSCULAR | Status: DC | PRN

## 2014-10-17 NOTE — Transfer of Care (Signed)
Immediate Anesthesia Transfer of Care Note  Patient: Jose Howell  Procedure(s) Performed: Procedure(s) with comments: MRI RIGHT SHOULDER WITHOUT (Right) - DR. DYER/MRI  Patient Location: PACU  Anesthesia Type:General  Level of Consciousness: awake, alert , oriented and patient cooperative  Airway & Oxygen Therapy: Patient Spontanous Breathing and Patient connected to nasal cannula oxygen  Post-op Assessment: Report given to RN, Post -op Vital signs reviewed and stable and Patient moving all extremities X 4  Post vital signs: Reviewed and stable  Last Vitals:  Filed Vitals:   10/17/14 0915  BP: 144/92  Pulse: 88  Temp: 36.6 C  Resp: 16    Complications: No apparent anesthesia complications

## 2014-10-18 ENCOUNTER — Encounter (HOSPITAL_COMMUNITY): Payer: Self-pay | Admitting: Radiology

## 2014-10-20 MED FILL — Midazolam HCl Inj 2 MG/2ML (Base Equivalent): INTRAMUSCULAR | Qty: 2 | Status: AC

## 2014-10-20 MED FILL — Propofol IV Emul 200 MG/20ML (10 MG/ML): INTRAVENOUS | Qty: 20 | Status: AC

## 2014-10-20 MED FILL — Fentanyl Citrate Preservative Free (PF) Inj 100 MCG/2ML: INTRAMUSCULAR | Qty: 2 | Status: AC

## 2014-10-20 MED FILL — Lidocaine HCl IV Inj 20 MG/ML: INTRAVENOUS | Qty: 5 | Status: AC

## 2014-12-07 ENCOUNTER — Ambulatory Visit: Payer: Non-veteran care

## 2014-12-12 ENCOUNTER — Ambulatory Visit: Payer: No Typology Code available for payment source | Attending: Orthopaedic Surgery

## 2014-12-12 DIAGNOSIS — M25611 Stiffness of right shoulder, not elsewhere classified: Secondary | ICD-10-CM | POA: Diagnosis not present

## 2014-12-12 DIAGNOSIS — R29898 Other symptoms and signs involving the musculoskeletal system: Secondary | ICD-10-CM | POA: Diagnosis present

## 2014-12-12 DIAGNOSIS — M25511 Pain in right shoulder: Secondary | ICD-10-CM | POA: Insufficient documentation

## 2014-12-12 NOTE — Patient Instructions (Signed)
ROM: Pendulum (Circular)  Let right arm move in circle clockwise, then counterclockwise, by rocking body weight in circular pattern. Circle _10___ times each direction per set. Do _3___ sessions per day.  Pendulum Side to Side  Bend forward 90 at waist, leaning on table for support. Rock body from side to side and let arm swing freely. Repeat _10___ times. Do __3__ sessions per day.  Finger Flexors  Keeping right fingertips straight, press putty toward base of palm. Repeat __20__ times. Do __3__ sessions per day. Activity: Squeeze flour sifter, plastic squeeze bottles, [place]key baster, juice from fruit.*    Elevation: Shrug (Distal Resist)  Also add circles forward and back   Lift shoulders straight up, then return. Maintain same speed up and down. Avoid moving head and neck forward. Repeat _10___ times per set. Do __1-2__ sets per session. Do many times daily. Copyright  VHI. All rights reserved.   .    Clasp hands together and raise arms above head, keeping elbows as straight as possible. Can be done sitting or lying.   Copyright  VHI. All rights reserved.   [facility] [address], [address] [place], [place] [address] Phone # [phone] Fax [phone]

## 2014-12-12 NOTE — Therapy (Signed)
[facility] [address], [address] [place], [place], [address] Phone: [phone]   Fax:  [phone]  Physical Therapy Evaluation  Patient Details  Name: AZREAL STTHOMAS MRN: [id number] Date of Birth: May 08, 1950 Referring Provider: Annell Greening, MD  Encounter Date: 12/12/2014      PT End of Session - 12/12/14 1237    Visit Number 1   Number of Visits 10   Date for PT Re-Evaluation 02/06/15   Authorization Type [employer]   Authorization Time Period 11/21/14-08/18/15   Authorization - Visit Number 1   Authorization - Number of Visits 36   PT Start Time 1204   PT Stop Time 1233   PT Time Calculation (min) 29 min   Activity Tolerance Patient tolerated treatment well   Behavior During Therapy [facility] for tasks assessed/performed      Past Medical History  Diagnosis Date  . GERD (gastroesophageal reflux disease)   . Arthritis     Past Surgical History  Procedure Laterality Date  . Umbilical hernia repair    . Radiology with anesthesia Right 10/17/2014    Procedure: MRI RIGHT SHOULDER WITHOUT;  Surgeon: Medication Radiologist, MD;  Location: [facility] OR;  Service: Radiology;  Laterality: Right;  DR. DYER/MRI    There were no vitals filed for this visit.  Visit Diagnosis:  Shoulder stiffness, right - Plan: PT plan of care cert/re-cert  Shoulder weakness - Plan: PT plan of care cert/re-cert  Pain in joint of right shoulder - Plan: PT plan of care cert/re-cert      Subjective Assessment - 12/12/14 1203    Subjective Pt presents to PT 2 weeks s/p Rt shoulder scope with open biceps tenodesis on 11/27/14.  Pt reports wear and tear on his shoulder with working in [employer].   Pertinent History Rt shoulder open biceps tenodesis and shoulder scope-11/27/14   Patient Stated Goals rehab shoulder   Currently in Pain? Yes   Pain Score 7   with movement   Pain Location Shoulder   Pain Orientation Right   Pain Type Surgical pain   Pain  Onset 1 to 4 weeks ago   Pain Frequency Intermittent   Aggravating Factors  moving the arm   Pain Relieving Factors weaing the sling, medication, not moving it            [facility] PT Assessment - 12/12/14 0001    Assessment   Medical Diagnosis s/p Rt shoulder scope with open biceps tenodesis   Referring Provider Annell Greening, MD   Onset Date/Surgical Date 11/27/14   Hand Dominance Right   Next MD Visit 1 month   Precautions   Precautions Other (comment)  MD orders: no resistance exercises! Follow protocol   Precaution Comments follow post op protocol   Restrictions   Weight Bearing Restrictions No   Balance Screen   Has the patient fallen in the past 6 months No   Has the patient had a decrease in activity level because of a fear of falling?  No   Home Tourist information centre manager residence   Prior Function   Level of Independence Independent   Vocation Retired   IT consultant   Overall Cognitive Status Within Functional Limits for tasks assessed   Observation/Other Assessments   Focus on Therapeutic Outcomes (FOTO)  78% limitation   Posture/Postural Control   Posture/Postural Control No significant limitations   ROM / Strength   AROM / PROM / Strength AROM;PROM;Strength   AROM  Overall AROM  Deficits   Overall AROM Comments Not tested on Rt due to post-op protocol   AROM Assessment Site Shoulder   PROM   Overall PROM  Deficits   Overall PROM Comments Lt shoulder PROM is full   PROM Assessment Site Shoulder   Right/Left Shoulder Right   Right Shoulder Flexion 81 Degrees   Right Shoulder Internal Rotation --  normal   Right Shoulder External Rotation 30 Degrees  limited by protocol   Strength   Overall Strength Unable to assess   Palpation   Palpation comment surgical incision is healing well.  Raised area of edema and scar tissue over incision at anterior glenohumeral joint.  Tenderness about the glenohumeral joint.                              PT Education - 12/12/14 1227    Education provided Yes   Education Details sling exercises, AAROM flexion in supine   Person(s) Educated Patient   Methods Explanation;Handout;Demonstration   Comprehension Verbalized understanding;Returned demonstration          PT Short Term Goals - 12/12/14 1314    PT SHORT TERM GOAL #1   Title be independent in initial HEP   Time 4   Period Weeks   Status New   PT SHORT TERM GOAL #2   Title demonstrate Rt shoulder PROM flexion to > or = to 120 degrees to improve mobility   Time 8   Period Weeks   Status New   PT SHORT TERM GOAL #3   Title report > or = to 50% use of Rt UE for light home tasks   Time 8   Period Weeks   Status New           PT Long Term Goals - 12/12/14 1200    PT LONG TERM GOAL #1   Title be independent in advanced HEP   Time 8   Period Weeks   Status New   PT LONG TERM GOAL #2   Title reduce FOTO to < or 39% limitation   Time 8   Period Weeks   Status New   PT LONG TERM GOAL #3   Title demonstrate > or = to 120 degrees Rt shoulder AROM to improve overhead reaching   Time 8   Period Weeks   Status New   PT LONG TERM GOAL #4   Title demonstrate 4-/5 Rt shoulder strength to improve endurance with use.   Time 8   Period Weeks   Status New   PT LONG TERM GOAL #5   Title demonstrate full Rt shoulder PROM throughout   Time 8   Period Weeks   Status New   Additional Long Term Goals   Additional Long Term Goals Yes   PT LONG TERM GOAL #6   Title report a 75% reduction in Rt shoulder pain with use.     Time 8   Period Weeks   Status New               Plan - 12/12/14 1153    Clinical Impression Statement Pt presents to PT s/p Rt shoulder scope with open biceps tenodesis on 11/27/14. Pt has been wearing sling since surgery.  Strength not tested today due to post-op restrictions.  Pt demonstrated Rt shoulder PROM flexion to 81 degrees and ER to 30 degrees  (limited by protocol).  FOTO score is 77%  limitation.  Pt will benefit from PT for ROM and strength advancement as allowed by protocol to safely return to use of Rt UE.   Pt will benefit from skilled therapeutic intervention in order to improve on the following deficits Decreased strength;Decreased scar mobility;Impaired flexibility;Pain;Decreased activity tolerance;Decreased range of motion   Rehab Potential Good   PT Frequency 3x / week   PT Duration 8 weeks   PT Treatment/Interventions ADLs/Self Care Home Management;Cryotherapy;Electrical Stimulation;Moist Heat;Therapeutic exercise;Therapeutic activities;Ultrasound;Neuromuscular re-education;Patient/family education;Manual techniques;Vasopneumatic Device;Taping;Passive range of motion   PT Next Visit Plan ROM per protocol per MD, no resistance exercises at this time.  Pain management as needed.     Consulted and Agree with Plan of Care Patient          G-Codes - Jan 10, 2015 1159    Functional Assessment Tool Used FOTO: 78% limitation   Functional Limitation Other PT primary   Other PT Primary Current Status (X3244) At least 60 percent but less than 80 percent impaired, limited or restricted   Other PT Primary Goal Status (W1027) At least 20 percent but less than 40 percent impaired, limited or restricted       Problem List Patient Active Problem List   Diagnosis Date Noted  . Routine general medical examination at a health care facility 03/11/2011  . Elevated blood-pressure reading without diagnosis of hypertension 03/11/2011    TAKACS,KELLY, PT 01/10/2015, 2:04 PM  [facility] [address] [place], [place], [address] Phone: [phone]   Fax:  [phone]  Name: JAMARKIS BRANAM MRN: [id number] Date of Birth: Oct 19, 1950

## 2014-12-13 ENCOUNTER — Ambulatory Visit: Payer: No Typology Code available for payment source | Admitting: Physical Therapy

## 2014-12-13 ENCOUNTER — Encounter: Payer: Self-pay | Admitting: Physical Therapy

## 2014-12-13 DIAGNOSIS — M25511 Pain in right shoulder: Secondary | ICD-10-CM

## 2014-12-13 DIAGNOSIS — M25611 Stiffness of right shoulder, not elsewhere classified: Secondary | ICD-10-CM

## 2014-12-13 DIAGNOSIS — R29898 Other symptoms and signs involving the musculoskeletal system: Secondary | ICD-10-CM

## 2014-12-13 NOTE — Therapy (Signed)
[facility] [address] [place], [place], [address] Phone: [phone]   Fax:  [phone]  Physical Therapy Treatment  Patient Details  Name: Jose Howell MRN: [id number] Date of Birth: 03/31/1950 Referring Provider: Annell Greening, MD  Encounter Date: 12/13/2014      PT End of Session - 12/13/14 0823    Visit Number 2   Number of Visits 10   Date for PT Re-Evaluation 02/06/15   Authorization Type [employer]   Authorization Time Period 11/21/14-08/18/15   Authorization - Visit Number 2   Authorization - Number of Visits 36   PT Start Time 0800   PT Stop Time 0855   PT Time Calculation (min) 55 min   Activity Tolerance Patient tolerated treatment well   Behavior During Therapy [facility] for tasks assessed/performed      Past Medical History  Diagnosis Date  . GERD (gastroesophageal reflux disease)   . Arthritis     Past Surgical History  Procedure Laterality Date  . Umbilical hernia repair    . Radiology with anesthesia Right 10/17/2014    Procedure: MRI RIGHT SHOULDER WITHOUT;  Surgeon: Medication Radiologist, MD;  Location: [facility] OR;  Service: Radiology;  Laterality: Right;  DR. DYER/MRI    There were no vitals filed for this visit.  Visit Diagnosis:  Shoulder stiffness, right  Shoulder weakness  Pain in joint of right shoulder      Subjective Assessment - 12/13/14 0807    Subjective Pt reports his pain as 4/10 with resting up to 8/10 with motions as pendulum. .    Pertinent History Rt shoulder scope with open biceps tenodesis on 10/17/2014.    Currently in Pain? Yes   Pain Score 4   up to 7/10   Pain Location Shoulder   Pain Orientation Right   Pain Type Surgical pain   Pain Onset 1 to 4 weeks ago   Pain Frequency Intermittent   Multiple Pain Sites No                         [facility] Adult PT Treatment/Exercise - 12/13/14 0001    Bed Mobility   Bed Mobility --  Pt wishes to be  called [name]   Posture/Postural Control   Posture/Postural Control No significant limitations   Exercises   Exercises Shoulder   Shoulder Exercises: Standing   Protraction --   Retraction Strengthening;20 reps   Other Standing Exercises Pendulum exercises circles each side, side to sside, flexion/ext x 1 min each   Other Standing Exercises shoulder bil elevation, unillateral and shoulder rolls forward/backward each x 1 min    Modalities   Modalities Cryotherapy   Cryotherapy   Number Minutes Cryotherapy 10 Minutes   Cryotherapy Location Shoulder   Type of Cryotherapy Ice pack   Manual Therapy   Manual Therapy Soft tissue mobilization;Passive ROM   Manual therapy comments supine PROM into flexion and scapula abduction   Soft tissue mobilization release of rhomboids  STW to pectoralis minor and ant incision side                PT Education - 12/13/14 0849    Education provided Yes   Education Details pendulum flexion/extension, and scapular retraction   Person(s) Educated Patient   Methods Explanation;Handout;Demonstration   Comprehension Verbalized understanding;Returned demonstration          PT Short Term Goals - 12/12/14 1314    PT SHORT TERM  GOAL #1   Title be independent in initial HEP   Time 4   Period Weeks   Status New   PT SHORT TERM GOAL #2   Title demonstrate Rt shoulder PROM flexion to > or = to 120 degrees to improve mobility   Time 8   Period Weeks   Status New   PT SHORT TERM GOAL #3   Title report > or = to 50% use of Rt UE for light home tasks   Time 8   Period Weeks   Status New           PT Long Term Goals - 12-28-2014 1200    PT LONG TERM GOAL #1   Title be independent in advanced HEP   Time 8   Period Weeks   Status New   PT LONG TERM GOAL #2   Title reduce FOTO to < or 39% limitation   Time 8   Period Weeks   Status New   PT LONG TERM GOAL #3   Title demonstrate > or = to 120 degrees Rt shoulder AROM to improve overhead  reaching   Time 8   Period Weeks   Status New   PT LONG TERM GOAL #4   Title demonstrate 4-/5 Rt shoulder strength to improve endurance with use.   Time 8   Period Weeks   Status New   PT LONG TERM GOAL #5   Title demonstrate full Rt shoulder PROM throughout   Time 8   Period Weeks   Status New   Additional Long Term Goals   Additional Long Term Goals Yes   PT LONG TERM GOAL #6   Title report a 75% reduction in Rt shoulder pain with use.     Time 8   Period Weeks   Status New               Plan - 12/13/14 1610    Clinical Impression Statement Pt is able to perform pendulum and gentle ROM exercises very well in PT clinic. Pt is wearing sling for protection since surgery. Advance exercises per protocol.   Pt will benefit from skilled therapeutic intervention in order to improve on the following deficits Decreased strength;Decreased scar mobility;Impaired flexibility;Pain;Decreased activity tolerance;Decreased range of motion   Rehab Potential Good   PT Frequency 3x / week   PT Duration 8 weeks   PT Treatment/Interventions ADLs/Self Care Home Management;Cryotherapy;Electrical Stimulation;Moist Heat;Therapeutic exercise;Therapeutic activities;Ultrasound;Neuromuscular re-education;Patient/family education;Manual techniques;Vasopneumatic Device;Taping;Passive range of motion   PT Next Visit Plan Review pendulum exercises, and elbow and wrist flexion/extensiont    Consulted and Agree with Plan of Care Patient          G-Codes - 12-28-2014 1159    Functional Assessment Tool Used FOTO: 78% limitation   Functional Limitation Other PT primary   Other PT Primary Current Status (R6045) At least 60 percent but less than 80 percent impaired, limited or restricted   Other PT Primary Goal Status (W0981) At least 20 percent but less than 40 percent impaired, limited or restricted      Problem List Patient Active Problem List   Diagnosis Date Noted  . Routine general medical  examination at a health care facility 03/11/2011  . Elevated blood-pressure reading without diagnosis of hypertension 03/11/2011    NAUMANN-HOUEGNIFIO,Cinda Hara PTA 12/13/2014, 9:40 AM  [facility] [address], [address] [place], [place], [address] Phone: [phone]   Fax:  [phone]  Name: EVERTON BERTHA  MRN: [id number] Date of Birth: 01/14/1951

## 2014-12-13 NOTE — Patient Instructions (Signed)
ROM: Pendulum (Flexion / Extension)    Let right arm hang and use momentum from body to swing arm forward and back. Progress from small to larger swings. Repeat _10 times per set.  Do 2-3sets per session. Do 2-3 sessions per day. Or perform this exercise for one minute  http://orth.exer.us/936   Copyright  VHI. All rights reserved.  Scapular Retraction (Standing)    With arms at sides, pinch shoulder blades together. Repeat _10___ times per set. Do _2-3___ sets per session. Do 2-3____ sessions per day.  http://orth.exer.us/944   Copyright  VHI. All rights reserved.

## 2014-12-15 ENCOUNTER — Encounter: Payer: Managed Care, Other (non HMO) | Admitting: Physical Therapy

## 2014-12-18 ENCOUNTER — Ambulatory Visit: Payer: No Typology Code available for payment source

## 2014-12-18 DIAGNOSIS — M25611 Stiffness of right shoulder, not elsewhere classified: Secondary | ICD-10-CM | POA: Diagnosis not present

## 2014-12-18 DIAGNOSIS — M25511 Pain in right shoulder: Secondary | ICD-10-CM

## 2014-12-18 DIAGNOSIS — R29898 Other symptoms and signs involving the musculoskeletal system: Secondary | ICD-10-CM

## 2014-12-18 NOTE — Patient Instructions (Addendum)
Cane Exercise: Flexion    Lie on back, holding cane above chest. Keeping arms as straight as possible, lower cane toward floor beyond head (do gently). Hold _10___ seconds. Repeat __10__ times. Do __3__ sessions per day.  http://gt2.exer.us/91   Copyright  VHI. All rights reserved.   Flexion (Passive)    Sit next to a table or counter top. Sitting upright, slide forearm forward along table, bending from the waist until a stretch is felt. Hold _10___ seconds. Repeat _10___ times. Do __3__ sessions per day.  Copyright  VHI. All rights reserved.  [facility] [address], [address] [place], [place] [address] Phone # [phone] Fax [phone]

## 2014-12-18 NOTE — Therapy (Signed)
[facility] [address], [address] [place], [place], [address] Phone: [phone]   Fax:  [phone]  Physical Therapy Treatment  Patient Details  Name: Jose Howell MRN: [id number] Date of Birth: November 16, 1950 Referring Provider: Annell GreeningYates, Mark, MD  Encounter Date: 12/18/2014      PT End of Session - 12/18/14 0842    Visit Number 3   Number of Visits 10   Date for PT Re-Evaluation 02/06/15   Authorization Type [employer]   Authorization Time Period 11/21/14-08/18/15   Authorization - Visit Number 3   Authorization - Number of Visits 36   PT Start Time 0800   PT Stop Time 0853   PT Time Calculation (min) 53 min   Activity Tolerance Patient tolerated treatment well   Behavior During Therapy [facility] for tasks assessed/performed      Past Medical History  Diagnosis Date  . GERD (gastroesophageal reflux disease)   . Arthritis     Past Surgical History  Procedure Laterality Date  . Umbilical hernia repair    . Radiology with anesthesia Right 10/17/2014    Procedure: MRI RIGHT SHOULDER WITHOUT;  Surgeon: Medication Radiologist, MD;  Location: [facility] OR;  Service: Radiology;  Laterality: Right;  DR. DYER/MRI    There were no vitals filed for this visit.  Visit Diagnosis:  Shoulder stiffness, right  Shoulder weakness  Pain in joint of right shoulder      Subjective Assessment - 12/18/14 0805    Subjective Pt reports that Rt shoulder has been sore especially at night.     Pertinent History Rt shoulder scope with open biceps tenodesis on 10/17/2014.    Currently in Pain? Yes   Pain Score 6    Pain Location Shoulder   Pain Orientation Right   Pain Descriptors / Indicators Aching;Sore;Tightness   Pain Type Surgical pain   Pain Onset 1 to 4 weeks ago   Pain Frequency Intermittent   Aggravating Factors  night time, moving the arm   Pain Relieving Factors wearing the sleep, medicaiton, not moving the arm                          [facility] - 12/18/14 0001    Exercises   Exercises Wrist;Shoulder   Shoulder Exercises: Supine   Flexion AAROM;Both;20 reps  with cane   Other Supine Exercises chest press with cane 2x10   Shoulder Exercises: Pulleys   Flexion 2 minutes;Other (comment)  2x2 minutes.  Verbal cues for perform gently   Shoulder Exercises: ROM/Strengthening   Other ROM/Strengthening Exercises digiflex: green x2 minutes   Shoulder Exercises: Stretch   Table Stretch - Flexion 10 seconds  10 seconds 2x10   Wrist Exercises   Wrist Flexion Right;20 reps   Bar Weights/Barbell (Wrist Flexion) 3 lbs   Wrist Extension Strengthening;Right;20 reps   Bar Weights/Barbell (Wrist Extension) 3 lbs   Modalities   Modalities Cryotherapy   Cryotherapy   Number Minutes Cryotherapy 10 Minutes   Cryotherapy Location Shoulder   Type of Cryotherapy Ice pack                PT Education - 12/18/14 0823    Education provided Yes   Education Details HEP: table slides, overhead cane flexion, chest press   Person(s) Educated Patient   Methods Explanation;Demonstration;Handout   Comprehension Verbalized understanding          PT Short Term Goals - 12/18/14 [id number]  PT SHORT TERM GOAL #1   Title be independent in initial HEP   Status Achieved   PT SHORT TERM GOAL #2   Title demonstrate Rt shoulder PROM flexion to > or = to 120 degrees to improve mobility   Time 4   Period Weeks   Status --  AAROM flexion 110 degrees   PT SHORT TERM GOAL #3   Title report > or = to 50% use of Rt UE for light home tasks   Time 4   Period Weeks   Status On-going  not using per post op restriction           PT Long Term Goals - 12/12/14 1200    PT LONG TERM GOAL #1   Title be independent in advanced HEP   Time 8   Period Weeks   Status New   PT LONG TERM GOAL #2   Title reduce FOTO to < or 39% limitation   Time 8   Period Weeks   Status New   PT  LONG TERM GOAL #3   Title demonstrate > or = to 120 degrees Rt shoulder AROM to improve overhead reaching   Time 8   Period Weeks   Status New   PT LONG TERM GOAL #4   Title demonstrate 4-/5 Rt shoulder strength to improve endurance with use.   Time 8   Period Weeks   Status New   PT LONG TERM GOAL #5   Title demonstrate full Rt shoulder PROM throughout   Time 8   Period Weeks   Status New   Additional Long Term Goals   Additional Long Term Goals Yes   PT LONG TERM GOAL #6   Title report a 75% reduction in Rt shoulder pain with use.     Time 8   Period Weeks   Status New               Plan - 12/18/14 0806    Clinical Impression Statement Pt is following post-op protocol and is tolerating exercises with mild discomfort.  Pt is wearing sling during sleep and when outside of the house only.  Pt with increased Rt shoulder pain with sleep at night.  Pt will benefit from skilled PT for safe progression of activity per MD post-op protocol for improved strength and AROM of Rt UE.     Pt will benefit from skilled therapeutic intervention in order to improve on the following deficits Decreased strength;Decreased scar mobility;Impaired flexibility;Pain;Decreased activity tolerance;Decreased range of motion   Rehab Potential Good   PT Frequency 3x / week   PT Duration 8 weeks   PT Treatment/Interventions ADLs/Self Care Home Management;Cryotherapy;Electrical Stimulation;Moist Heat;Therapeutic exercise;Therapeutic activities;Ultrasound;Neuromuscular re-education;Patient/family education;Manual techniques;Vasopneumatic Device;Taping;Passive range of motion   PT Next Visit Plan Review new HEP, continue to follow protocol.  No resistance per MD.    Consulted and Agree with Plan of Care Patient        Problem List Patient Active Problem List   Diagnosis Date Noted  . Routine general medical examination at a health care facility 03/11/2011  . Elevated blood-pressure reading without  diagnosis of hypertension 03/11/2011    Kalii Chesmore, PT 12/18/2014, 8:43 AM  [facility] [address], [address] [place], [place], [address] Phone: [phone]   Fax:  [phone]  Name: Jose Howell MRN: [id number] Date of Birth: 04-Jun-1950

## 2014-12-20 ENCOUNTER — Encounter: Payer: Self-pay | Admitting: Physical Therapy

## 2014-12-20 ENCOUNTER — Ambulatory Visit: Payer: No Typology Code available for payment source | Admitting: Physical Therapy

## 2014-12-20 DIAGNOSIS — M25511 Pain in right shoulder: Secondary | ICD-10-CM

## 2014-12-20 DIAGNOSIS — R29898 Other symptoms and signs involving the musculoskeletal system: Secondary | ICD-10-CM

## 2014-12-20 DIAGNOSIS — M25611 Stiffness of right shoulder, not elsewhere classified: Secondary | ICD-10-CM

## 2014-12-20 NOTE — Therapy (Signed)
[facility] [address] [place], [place], [address] Phone: [phone]   Fax:  [phone]  Physical Therapy Treatment  Patient Details  Name: Jose Howell MRN: [id number] Date of Birth: 1950/03/30 Referring Provider: Annell Greening, MD  Encounter Date: 12/20/2014      PT End of Session - 12/20/14 1509    Visit Number 4   Number of Visits 10   Date for PT Re-Evaluation 02/06/15   Authorization Type [employer]   Authorization Time Period 11/21/14-08/18/15   Authorization - Visit Number 3   Authorization - Number of Visits 36   PT Start Time 1447   PT Stop Time 1544   PT Time Calculation (min) 57 min   Activity Tolerance Patient tolerated treatment well   Behavior During Therapy [name] for tasks assessed/performed      Past Medical History  Diagnosis Date  . GERD (gastroesophageal reflux disease)   . Arthritis     Past Surgical History  Procedure Laterality Date  . Umbilical hernia repair    . Radiology with anesthesia Right 10/17/2014    Procedure: MRI RIGHT SHOULDER WITHOUT;  Surgeon: Medication Radiologist, MD;  Location: [facility] OR;  Service: Radiology;  Laterality: Right;  DR. DYER/MRI    There were no vitals filed for this visit.  Visit Diagnosis:  Shoulder stiffness, right  Shoulder weakness  Pain in joint of right shoulder      Subjective Assessment - 12/20/14 1454    Subjective Pt rates his pain as 1/10 up to 5/10   Pertinent History Rt shoulder scope with open biceps tenodesis on 10/17/2014.    Patient Stated Goals rehab shoulder   Currently in Pain? Yes   Pain Score 1    Pain Location Shoulder   Pain Orientation Right   Pain Descriptors / Indicators Aching;Sore;Tightness   Pain Type Surgical pain   Pain Onset More than a month ago   Pain Frequency Intermittent   Multiple Pain Sites No                         [facility] Adult PT Treatment/Exercise - 12/20/14 0001    Bed Mobility    Bed Mobility --  Pt wishes to be called Jose Howell   Posture/Postural Control   Posture/Postural Control No significant limitations   Exercises   Exercises Wrist;Shoulder   Shoulder Exercises: Supine   Flexion AAROM;Both;20 reps  with cane, AAROM 115degrees   Other Supine Exercises chest press with cane 2x10   Shoulder Exercises: Pulleys   Flexion 2 minutes;Other (comment)   Shoulder Exercises: ROM/Strengthening   Other ROM/Strengthening Exercises digiflex: green x2 minutes   Modalities   Modalities Cryotherapy   Cryotherapy   Number Minutes Cryotherapy 10 Minutes   Cryotherapy Location Shoulder   Type of Cryotherapy Ice pack   Manual Therapy   Manual Therapy Soft tissue mobilization;Passive ROM   Manual therapy comments supine PROM into flexion and scapula abduction   Soft tissue mobilization release of rhomboids   Passive ROM PROM into ABD ~35 & passive ER/IR in 30degrees of abduction in supine                  PT Short Term Goals - 12/18/14 0809    PT SHORT TERM GOAL #1   Title be independent in initial HEP   Status Achieved   PT SHORT TERM GOAL #2   Title demonstrate Rt shoulder PROM flexion to > or =  to 120 degrees to improve mobility   Time 4   Period Weeks   Status --  AAROM flexion 110 degrees   PT SHORT TERM GOAL #3   Title report > or = to 50% use of Rt UE for light home tasks   Time 4   Period Weeks   Status On-going  not using per post op restriction           PT Long Term Goals - 12/12/14 1200    PT LONG TERM GOAL #1   Title be independent in advanced HEP   Time 8   Period Weeks   Status New   PT LONG TERM GOAL #2   Title reduce FOTO to < or 39% limitation   Time 8   Period Weeks   Status New   PT LONG TERM GOAL #3   Title demonstrate > or = to 120 degrees Rt shoulder AROM to improve overhead reaching   Time 8   Period Weeks   Status New   PT LONG TERM GOAL #4   Title demonstrate 4-/5 Rt shoulder strength to improve endurance  with use.   Time 8   Period Weeks   Status New   PT LONG TERM GOAL #5   Title demonstrate full Rt shoulder PROM throughout   Time 8   Period Weeks   Status New   Additional Long Term Goals   Additional Long Term Goals Yes   PT LONG TERM GOAL #6   Title report a 75% reduction in Rt shoulder pain with use.     Time 8   Period Weeks   Status New               Plan - 12/20/14 1510    Clinical Impression Statement Pt with post-op protocoll and is tolerating activities, and is very determined and needs reminders to follow protocol. Pt will continue to benefit from skilled Pt for safe progression of activity and to improve strength and AROM of Rt UE.   Pt will benefit from skilled therapeutic intervention in order to improve on the following deficits Decreased strength;Decreased scar mobility;Impaired flexibility;Pain;Decreased activity tolerance;Decreased range of motion;Increased fascial restricitons   Rehab Potential Good   PT Frequency 3x / week   PT Duration 8 weeks   PT Treatment/Interventions ADLs/Self Care Home Management;Cryotherapy;Electrical Stimulation;Moist Heat;Therapeutic exercise;Therapeutic activities;Ultrasound;Neuromuscular re-education;Patient/family education;Manual techniques;Vasopneumatic Device;Taping;Passive range of motion   PT Next Visit Plan Continue to follow protocol. no resistance per MD   Consulted and Agree with Plan of Care Patient        Problem List Patient Active Problem List   Diagnosis Date Noted  . Routine general medical examination at a health care facility 03/11/2011  . Elevated blood-pressure reading without diagnosis of hypertension 03/11/2011    Jose Howell,Jose Howell PTA 12/20/2014, 3:27 PM  [facility] [address] [place], [place], [address] Phone: [phone]   Fax:  [phone]  Name: Jose Howell MRN: [id number] Date of Birth: 1950-08-31

## 2014-12-27 ENCOUNTER — Ambulatory Visit: Payer: No Typology Code available for payment source

## 2014-12-27 DIAGNOSIS — M25511 Pain in right shoulder: Secondary | ICD-10-CM

## 2014-12-27 DIAGNOSIS — M25611 Stiffness of right shoulder, not elsewhere classified: Secondary | ICD-10-CM

## 2014-12-27 DIAGNOSIS — R29898 Other symptoms and signs involving the musculoskeletal system: Secondary | ICD-10-CM

## 2014-12-27 NOTE — Therapy (Signed)
[facility] [address], [address] [place], [place], [address] Phone: [phone]   Fax:  [phone]  Physical Therapy Treatment  Patient Details  Name: Jose Howell MRN: [id number] Date of Birth: 1950/08/03 Referring Provider: Annell Greening, MD  Encounter Date: 12/27/2014      PT End of Session - 12/27/14 1053    Visit Number 5   Number of Visits 10   Date for PT Re-Evaluation 02/06/15   Authorization Type [employer]   Authorization Time Period 11/21/14-08/18/15   Authorization - Visit Number 5   Authorization - Number of Visits 36   PT Start Time 1015   PT Stop Time 1105   PT Time Calculation (min) 50 min   Activity Tolerance Patient tolerated treatment well   Behavior During Therapy [facility] for tasks assessed/performed      Past Medical History  Diagnosis Date  . GERD (gastroesophageal reflux disease)   . Arthritis     Past Surgical History  Procedure Laterality Date  . Umbilical hernia repair    . Radiology with anesthesia Right 10/17/2014    Procedure: MRI RIGHT SHOULDER WITHOUT;  Surgeon: Medication Radiologist, MD;  Location: [facility] OR;  Service: Radiology;  Laterality: Right;  DR. DYER/MRI    There were no vitals filed for this visit.  Visit Diagnosis:  Shoulder stiffness, right  Shoulder weakness  Pain in joint of right shoulder      Subjective Assessment - 12/27/14 1020    Subjective Rt shoulder is sore today.  Not wearing sling.     Currently in Pain? Yes   Pain Score 2    Pain Location Shoulder   Pain Orientation Right   Pain Descriptors / Indicators Aching;Sore;Tightness   Pain Type Surgical pain   Pain Onset More than a month ago   Pain Frequency Intermittent   Aggravating Factors  night time, moving the arm   Pain Relieving Factors wearing sling, not moving the arm, medication at times            [facility] PT Assessment - 12/27/14 0001    PROM   Overall PROM  Deficits   PROM Assessment  Site Shoulder   Right/Left Shoulder Right   Right Shoulder Flexion 112 Degrees   Palpation   Palpation comment surgical incision is healing well.  Raised area of edema and scar tissue over incision at anterior glenohumeral joint.  Tenderness about the glenohumeral joint.                       [facility] Adult PT Treatment/Exercise - 12/27/14 0001    Shoulder Exercises: Supine   Flexion AAROM;Both;20 reps  with cane, AAROM 115degrees   Other Supine Exercises chest press with cane 2x10   Shoulder Exercises: Standing   Flexion AAROM;10 reps  use of finger ladder, verbal cues to reduce scapular elevati   Shoulder Exercises: Pulleys   Flexion 3 minutes   ABduction 2 minutes   Shoulder Exercises: ROM/Strengthening   Other ROM/Strengthening Exercises digiflex: green x2 minutes   Modalities   Modalities Cryotherapy   Cryotherapy   Number Minutes Cryotherapy 10 Minutes   Cryotherapy Location Shoulder   Type of Cryotherapy Ice pack   Manual Therapy   Manual Therapy Soft tissue mobilization;Passive ROM   Manual therapy comments supine PROM into flexion and scapula abduction   Passive ROM PROM to tolerance  PT Short Term Goals - 12/27/14 1021    PT SHORT TERM GOAL #1   Title be independent in initial HEP   Status Achieved   PT SHORT TERM GOAL #2   Title demonstrate Rt shoulder PROM flexion to > or = to 120 degrees to improve mobility   Time 4   Period Weeks   PT SHORT TERM GOAL #3   Title report > or = to 50% use of Rt UE for light home tasks   Time 4   Period Weeks   Status On-going  25-30% of the time           PT Long Term Goals - 12/12/14 1200    PT LONG TERM GOAL #1   Title be independent in advanced HEP   Time 8   Period Weeks   Status New   PT LONG TERM GOAL #2   Title reduce FOTO to < or 39% limitation   Time 8   Period Weeks   Status New   PT LONG TERM GOAL #3   Title demonstrate > or = to 120 degrees Rt shoulder AROM to  improve overhead reaching   Time 8   Period Weeks   Status New   PT LONG TERM GOAL #4   Title demonstrate 4-/5 Rt shoulder strength to improve endurance with use.   Time 8   Period Weeks   Status New   PT LONG TERM GOAL #5   Title demonstrate full Rt shoulder PROM throughout   Time 8   Period Weeks   Status New   Additional Long Term Goals   Additional Long Term Goals Yes   PT LONG TERM GOAL #6   Title report a 75% reduction in Rt shoulder pain with use.     Time 8   Period Weeks   Status New               Plan - 12/27/14 1024    Clinical Impression Statement Pt is limited by post-op protocol activities and is not allowed to perform strengthening per MD.  Pt is not sure when next MD appt is.  Pt with limited Rt shoulder use and strength s/p surgery.  Pt will benefit from skilled PT for Rt shoulder ROM, manual and modalities PRN.     Pt will benefit from skilled therapeutic intervention in order to improve on the following deficits Decreased strength;Decreased scar mobility;Impaired flexibility;Pain;Decreased activity tolerance;Decreased range of motion;Increased fascial restricitons   Rehab Potential Good   PT Frequency 3x / week   PT Duration 8 weeks   PT Treatment/Interventions ADLs/Self Care Home Management;Cryotherapy;Electrical Stimulation;Moist Heat;Therapeutic exercise;Therapeutic activities;Ultrasound;Neuromuscular re-education;Patient/family education;Manual techniques;Vasopneumatic Device;Taping;Passive range of motion   PT Next Visit Plan Continue to follow protocol. no resistance per MD   Consulted and Agree with Plan of Care Patient        Problem List Patient Active Problem List   Diagnosis Date Noted  . Routine general medical examination at a health care facility 03/11/2011  . Elevated blood-pressure reading without diagnosis of hypertension 03/11/2011    TAKACS,KELLY, PT 12/27/2014, 10:54 AM  [facility] [address] [place], [place], [address] Phone: [phone]   Fax:  [phone]  Name: Jose Howell MRN: [id number] Date of Birth: 17-Oct-1950

## 2014-12-29 ENCOUNTER — Encounter: Payer: Managed Care, Other (non HMO) | Admitting: Physical Therapy

## 2015-01-01 ENCOUNTER — Ambulatory Visit: Payer: No Typology Code available for payment source | Admitting: Physical Therapy

## 2015-01-01 ENCOUNTER — Encounter: Payer: Self-pay | Admitting: Physical Therapy

## 2015-01-01 DIAGNOSIS — M25611 Stiffness of right shoulder, not elsewhere classified: Secondary | ICD-10-CM | POA: Diagnosis not present

## 2015-01-01 DIAGNOSIS — R29898 Other symptoms and signs involving the musculoskeletal system: Secondary | ICD-10-CM

## 2015-01-01 DIAGNOSIS — M25511 Pain in right shoulder: Secondary | ICD-10-CM

## 2015-01-01 NOTE — Therapy (Signed)
[facility] [address], [address] [place], [place], [address] Phone: [phone]   Fax:  [phone]  Physical Therapy Treatment  Patient Details  Name: Jose Howell MRN: [id number] Date of Birth: 07-18-1950 Referring Provider: Annell Greening, MD  Encounter Date: 01/01/2015      PT End of Session - 01/01/15 1100    Visit Number 6   Number of Visits 10   Date for PT Re-Evaluation 02/06/15   Authorization Type [employer]   Authorization Time Period 11/21/14-08/18/15   Authorization - Visit Number 6   Authorization - Number of Visits 36   PT Start Time 1016   PT Stop Time 1110   PT Time Calculation (min) 54 min   Activity Tolerance Patient tolerated treatment well   Behavior During Therapy [facility] for tasks assessed/performed      Past Medical History  Diagnosis Date  . GERD (gastroesophageal reflux disease)   . Arthritis     Past Surgical History  Procedure Laterality Date  . Umbilical hernia repair    . Radiology with anesthesia Right 10/17/2014    Procedure: MRI RIGHT SHOULDER WITHOUT;  Surgeon: Medication Radiologist, MD;  Location: [facility] OR;  Service: Radiology;  Laterality: Right;  DR. DYER/MRI    There were no vitals filed for this visit.  Visit Diagnosis:  Shoulder stiffness, right  Shoulder weakness  Pain in joint of right shoulder      Subjective Assessment - 01/01/15 1043    Subjective Pt reports reaching is improving as noticed with placing blanket over his bird cage at home   Currently in Pain? Yes   Pain Score --  up to 6/10 with certain movement   Pain Location Shoulder   Pain Orientation Right   Pain Descriptors / Indicators Aching;Sore;Tightness   Pain Type Surgical pain   Pain Onset More than a month ago   Pain Frequency Intermittent   Multiple Pain Sites No           [facility] PT Assessment - 01/01/15 0001    ROM / Strength   AROM / PROM / Strength AROM   AROM   Overall AROM  Deficits   Overall AROM Comments Not tested on Rt due to post-op protocol   AROM Assessment Site Shoulder   PROM   Overall PROM  Deficits   PROM Assessment Site Shoulder   Right/Left Shoulder Right   Right Shoulder Flexion 112 Degrees   Right Shoulder ABduction 85 Degrees   Palpation   Palpation comment surgical incision is healing well.  Raised area of edema and scar tissue over incision at anterior glenohumeral joint.  Tenderness about the glenohumeral joint.                       [facility] Adult PT Treatment/Exercise - 01/01/15 0001    Bed Mobility   Bed Mobility --  Pt wishes to be called Jose Howell   Posture/Postural Control   Posture/Postural Control No significant limitations   Exercises   Exercises Wrist;Shoulder   Shoulder Exercises: Seated   External Rotation AROM  right arm in neutral 2 x 10   Flexion AAROM;20 reps  with cane   Abduction AAROM;20 reps  with cane   Shoulder Exercises: Prone   External Rotation --   Shoulder Exercises: Standing   Flexion AAROM;10 reps   to peg # 10, verbal cues to reduce shoulder elevation   ABduction AAROM;10 reps  to peg #3  Shoulder Exercises: Pulleys   Flexion 3 minutes   ABduction 3 minutes   Shoulder Exercises: ROM/Strengthening   Other ROM/Strengthening Exercises digiflex: green x2 minutes   Modalities   Modalities Cryotherapy   Cryotherapy   Number Minutes Cryotherapy 10 Minutes   Cryotherapy Location Shoulder   Type of Cryotherapy Ice pack   Manual Therapy   Manual Therapy Soft tissue mobilization;Passive ROM   Manual therapy comments supine PROM into flexion and scapula abduction   Passive ROM PROM to tolerance                  PT Short Term Goals - 01/01/15 1104    PT SHORT TERM GOAL #1   Title be independent in initial HEP   Period Weeks   Status Achieved   PT SHORT TERM GOAL #2   Title demonstrate Rt shoulder PROM flexion to > or = to 120 degrees to improve mobility  105 as of 01/01/15   Time 4    Period Weeks   Status On-going   PT SHORT TERM GOAL #3   Title report > or = to 50% use of Rt UE for light home tasks   Time 4   Period Weeks   Status On-going           PT Long Term Goals - 01/01/15 1322    PT LONG TERM GOAL #1   Title be independent in advanced HEP   Time 8   Period Weeks   Status On-going   PT LONG TERM GOAL #2   Title reduce FOTO to < or 39% limitation   Time 8   Period Weeks   Status On-going   PT LONG TERM GOAL #3   Title demonstrate > or = to 120 degrees Rt shoulder AROM to improve overhead reaching   Time 8   Period Weeks   Status On-going   PT LONG TERM GOAL #4   Title demonstrate 4-/5 Rt shoulder strength to improve endurance with use.   Time 8   Period Weeks   Status On-going   PT LONG TERM GOAL #5   Title demonstrate full Rt shoulder PROM throughout   Time 8   Period Weeks   Status On-going   PT LONG TERM GOAL #6   Title report a 75% reduction in Rt shoulder pain with use.     Time 8   Period Weeks   Status On-going               Plan - 01/01/15 1100    Clinical Impression Statement Pt with improved AAROM and AROM in degrees right shoulder, not allowed to perform strengthening per MD. Pt has MD visit tomorrow Tuesday Nov 22nd, 2016. Pt will continue to benefit from skilled PT    Pt will benefit from skilled therapeutic intervention in order to improve on the following deficits Decreased strength;Decreased scar mobility;Impaired flexibility;Pain;Decreased activity tolerance;Decreased range of motion;Increased fascial restricitons   Rehab Potential Good   PT Frequency 3x / week   PT Duration 8 weeks   PT Treatment/Interventions ADLs/Self Care Home Management;Cryotherapy;Electrical Stimulation;Moist Heat;Therapeutic exercise;Therapeutic activities;Ultrasound;Neuromuscular re-education;Patient/family education;Manual techniques;Vasopneumatic Device;Taping;Passive range of motion   PT Next Visit Plan Continue to follow protocol. no  resistance per MD   Consulted and Agree with Plan of Care Patient        Problem List Patient Active Problem List   Diagnosis Date Noted  . Routine general medical examination at a health care facility 03/11/2011  .  Elevated blood-pressure reading without diagnosis of hypertension 03/11/2011    NAUMANN-HOUEGNIFIO,Tishawn Friedhoff PTA 01/01/2015, 1:31 PM  [facility] [address], [address] [place], [place], [address] Phone: [phone]   Fax:  [phone]  Name: Jose Howell MRN: [id number] Date of Birth: February 25, 1950

## 2015-01-02 ENCOUNTER — Encounter: Payer: Managed Care, Other (non HMO) | Admitting: Physical Therapy

## 2015-01-03 ENCOUNTER — Ambulatory Visit: Payer: No Typology Code available for payment source | Admitting: Physical Therapy

## 2015-01-03 DIAGNOSIS — M25611 Stiffness of right shoulder, not elsewhere classified: Secondary | ICD-10-CM

## 2015-01-03 DIAGNOSIS — M25511 Pain in right shoulder: Secondary | ICD-10-CM

## 2015-01-03 DIAGNOSIS — R29898 Other symptoms and signs involving the musculoskeletal system: Secondary | ICD-10-CM

## 2015-01-03 NOTE — Therapy (Signed)
[facility] [address] [place], [place], [address] Phone: [phone]   Fax:  [phone]  Physical Therapy Treatment  Patient Details  Name: Jose Howell MRN: [id number] Date of Birth: 1950/08/27 Referring Provider: Annell Greening, MD  Encounter Date: 01/03/2015      PT End of Session - 01/03/15 1332    Visit Number 7   Number of Visits 10   Date for PT Re-Evaluation 02/06/15   Authorization Type [employer]   Authorization Time Period 11/21/14-08/18/15   Authorization - Visit Number 7   Authorization - Number of Visits 36   PT Start Time 1145   PT Stop Time 1233   PT Time Calculation (min) 48 min   Activity Tolerance Patient tolerated treatment well   Behavior During Therapy [facility] for tasks assessed/performed      Past Medical History  Diagnosis Date  . GERD (gastroesophageal reflux disease)   . Arthritis     Past Surgical History  Procedure Laterality Date  . Umbilical hernia repair    . Radiology with anesthesia Right 10/17/2014    Procedure: MRI RIGHT SHOULDER WITHOUT;  Surgeon: Medication Radiologist, MD;  Location: [facility] OR;  Service: Radiology;  Laterality: Right;  DR. DYER/MRI    There were no vitals filed for this visit.  Visit Diagnosis:  Shoulder stiffness, right  Shoulder weakness  Pain in joint of right shoulder      Subjective Assessment - 01/03/15 1316    Subjective Pt saw MD yesterday, MD request to continue with to more weeks before starting with strengthening.   Pertinent History Rt shoulder scope with open biceps tenodesis on 10/17/2014.    Patient Stated Goals rehab shoulder   Currently in Pain? Yes   Pain Score 4    Pain Location Shoulder   Pain Orientation Right   Pain Descriptors / Indicators Aching;Sore;Tightness   Pain Type Surgical pain   Pain Onset More than a month ago   Pain Frequency Intermittent   Multiple Pain Sites No                          [facility] Adult PT Treatment/Exercise - 01/03/15 0001    Bed Mobility   Bed Mobility --  Pt wishes to be called Jose Howell   Posture/Postural Control   Posture/Postural Control No significant limitations   Exercises   Exercises Wrist;Shoulder   Shoulder Exercises: Supine   Flexion AAROM;Both;20 reps;10 reps  with cane   Other Supine Exercises chest press with cane 3x10   Shoulder Exercises: Seated   External Rotation AROM   Flexion AAROM;20 reps  with cane   Abduction AAROM;20 reps  with cane   Other Seated Exercises Biceps curls with 4# 3 x10   Shoulder Exercises: Standing   Flexion AAROM;10 reps  to peg # 10 2x10   ABduction AAROM;10 reps  to peg #5   Shoulder Exercises: Pulleys   Flexion 3 minutes   ABduction 3 minutes   Shoulder Exercises: ROM/Strengthening   Other ROM/Strengthening Exercises digiflex: green x2 minutes   Modalities   Modalities Cryotherapy   Cryotherapy   Number Minutes Cryotherapy 10 Minutes   Cryotherapy Location Shoulder   Type of Cryotherapy Ice pack   Manual Therapy   Manual Therapy Soft tissue mobilization   Manual therapy comments supine PROM into flexion and scapula abduction   Passive ROM PROM to tolerance  PT Short Term Goals - 01/01/15 1104    PT SHORT TERM GOAL #1   Title be independent in initial HEP   Period Weeks   Status Achieved   PT SHORT TERM GOAL #2   Title demonstrate Rt shoulder PROM flexion to > or = to 120 degrees to improve mobility  105 as of 01/01/15   Time 4   Period Weeks   Status On-going   PT SHORT TERM GOAL #3   Title report > or = to 50% use of Rt UE for light home tasks   Time 4   Period Weeks   Status On-going           PT Long Term Goals - 01/01/15 1322    PT LONG TERM GOAL #1   Title be independent in advanced HEP   Time 8   Period Weeks   Status On-going   PT LONG TERM GOAL #2   Title reduce FOTO to < or 39% limitation   Time 8   Period Weeks   Status On-going   PT  LONG TERM GOAL #3   Title demonstrate > or = to 120 degrees Rt shoulder AROM to improve overhead reaching   Time 8   Period Weeks   Status On-going   PT LONG TERM GOAL #4   Title demonstrate 4-/5 Rt shoulder strength to improve endurance with use.   Time 8   Period Weeks   Status On-going   PT LONG TERM GOAL #5   Title demonstrate full Rt shoulder PROM throughout   Time 8   Period Weeks   Status On-going   PT LONG TERM GOAL #6   Title report a 75% reduction in Rt shoulder pain with use.     Time 8   Period Weeks   Status On-going               Plan - 01/03/15 1333    Clinical Impression Statement Pt continues to improve with AAROM and PROM, discomfort with PROM into abduction at 75degrees. Pt with two more weeks of nor resisistance exercises per MD. Pt will continue to benefit from skilled PT   Pt will benefit from skilled therapeutic intervention in order to improve on the following deficits Decreased strength;Decreased scar mobility;Impaired flexibility;Pain;Decreased activity tolerance;Decreased range of motion;Increased fascial restricitons   Rehab Potential Good   PT Frequency 3x / week   PT Duration 8 weeks   PT Treatment/Interventions ADLs/Self Care Home Management;Cryotherapy;Electrical Stimulation;Moist Heat;Therapeutic exercise;Therapeutic activities;Ultrasound;Neuromuscular re-education;Patient/family education;Manual techniques;Vasopneumatic Device;Taping;Passive range of motion   PT Next Visit Plan Continue to follow protocol. no resistance per MD   Consulted and Agree with Plan of Care Patient        Problem List Patient Active Problem List   Diagnosis Date Noted  . Routine general medical examination at a health care facility 03/11/2011  . Elevated blood-pressure reading without diagnosis of hypertension 03/11/2011    NAUMANN-HOUEGNIFIO,Deshanti Adcox PTA 01/03/2015, 1:36 PM  [facility] [address] [place], [place], [address] Phone: [phone]   Fax:  [phone]  Name: Jose Howell MRN: [id number] Date of Birth: August 10, 1950

## 2015-01-08 ENCOUNTER — Encounter: Payer: Self-pay | Admitting: Physical Therapy

## 2015-01-08 ENCOUNTER — Ambulatory Visit: Payer: No Typology Code available for payment source | Admitting: Physical Therapy

## 2015-01-08 DIAGNOSIS — M25611 Stiffness of right shoulder, not elsewhere classified: Secondary | ICD-10-CM | POA: Diagnosis not present

## 2015-01-08 DIAGNOSIS — R29898 Other symptoms and signs involving the musculoskeletal system: Secondary | ICD-10-CM

## 2015-01-08 DIAGNOSIS — M25511 Pain in right shoulder: Secondary | ICD-10-CM

## 2015-01-08 NOTE — Therapy (Signed)
[facility] [address], [address] [place], [place], [address] Phone: [phone]   Fax:  [phone]  Physical Therapy Treatment  Patient Details  Name: Jose Howell MRN: [id number] Date of Birth: [birth year] Referring Provider: Annell Greening, MD  Encounter Date: 01/08/2015      PT End of Session - 01/08/15 1218    Visit Number 8   Number of Visits 10   Date for PT Re-Evaluation 02/06/15   Authorization Type [employer]   Authorization Time Period 11/21/14-08/18/15   Authorization - Visit Number 8   Authorization - Number of Visits 36   PT Start Time 1146   PT Stop Time 1242   PT Time Calculation (min) 56 min   Activity Tolerance Patient tolerated treatment well   Behavior During Therapy [facility] for tasks assessed/performed      Past Medical History  Diagnosis Date  . GERD (gastroesophageal reflux disease)   . Arthritis     Past Surgical History  Procedure Laterality Date  . Umbilical hernia repair    . Radiology with anesthesia Right 10/17/2014    Procedure: MRI RIGHT SHOULDER WITHOUT;  Surgeon: Medication Radiologist, MD;  Location: [facility] OR;  Service: Radiology;  Laterality: Right;  DR. DYER/MRI    There were no vitals filed for this visit.  Visit Diagnosis:  Shoulder stiffness, right  Shoulder weakness  Pain in joint of right shoulder      Subjective Assessment - 01/08/15 1156    Subjective Per MD visit from last week, pt is allowed to start with resistance on December 6th   Pertinent History Rt shoulder scope with open biceps tenodesis on 10/17/2014.    Currently in Pain? Yes   Pain Score 5    Pain Location Shoulder   Pain Orientation Right   Pain Descriptors / Indicators Aching;Sore;Tightness   Pain Type Surgical pain   Pain Onset More than a month ago   Pain Frequency Intermittent   Multiple Pain Sites No                         [facility] Adult PT Treatment/Exercise - 01/08/15 0001    Bed  Mobility   Bed Mobility --  Pt wishes to be called Jose Howell   Posture/Postural Control   Posture/Postural Control No significant limitations   Exercises   Exercises Wrist;Shoulder   Shoulder Exercises: Supine   Flexion AAROM;Both;20 reps;10 reps  with cane   Other Supine Exercises chest press with cane 3x10   Shoulder Exercises: Seated   External Rotation AROM  right arm in neutral    Flexion AAROM;20 reps  with cane   Abduction AAROM;20 reps  with cane   Other Seated Exercises Biceps curls with 4# 3 x10   Shoulder Exercises: Standing   Flexion AAROM;10 reps;20 reps  to peg #15   ABduction AAROM;10 reps  walladder 3 x 10, eg # 10   Other Standing Exercises Pendulum exercises circles each side, side to sside, flexion/ext x 1 min each   Shoulder Exercises: Pulleys   Flexion 3 minutes   ABduction 3 minutes   Shoulder Exercises: ROM/Strengthening   Other ROM/Strengthening Exercises digiflex: green 3x1 minute   Modalities   Modalities Cryotherapy   Cryotherapy   Number Minutes Cryotherapy 10 Minutes   Cryotherapy Location Shoulder   Type of Cryotherapy Ice pack   Manual Therapy   Manual Therapy Soft tissue mobilization   Manual therapy comments supine PROM into  flexion and scapula abduction   Passive ROM PROM to tolerance                  PT Short Term Goals - 01/08/15 1324    PT SHORT TERM GOAL #1   Title be independent in initial HEP   Time 4   Period Weeks   Status Achieved   PT SHORT TERM GOAL #2   Title demonstrate Rt shoulder PROM flexion to > or = to 120 degrees to improve mobility   Time 4   Period Weeks   Status On-going   PT SHORT TERM GOAL #3   Title report > or = to 50% use of Rt UE for light home tasks   Time 4   Period Weeks   Status On-going           PT Long Term Goals - 01/01/15 1322    PT LONG TERM GOAL #1   Title be independent in advanced HEP   Time 8   Period Weeks   Status On-going   PT LONG TERM GOAL #2   Title reduce  FOTO to < or 39% limitation   Time 8   Period Weeks   Status On-going   PT LONG TERM GOAL #3   Title demonstrate > or = to 120 degrees Rt shoulder AROM to improve overhead reaching   Time 8   Period Weeks   Status On-going   PT LONG TERM GOAL #4   Title demonstrate 4-/5 Rt shoulder strength to improve endurance with use.   Time 8   Period Weeks   Status On-going   PT LONG TERM GOAL #5   Title demonstrate full Rt shoulder PROM throughout   Time 8   Period Weeks   Status On-going   PT LONG TERM GOAL #6   Title report a 75% reduction in Rt shoulder pain with use.     Time 8   Period Weeks   Status On-going               Plan - 01/08/15 1322    Clinical Impression Statement Pt continues to improve with AAROM and PROM. Pt will be allowed to start with resistive exercises next week Dec 6th, per info from last weeks MD visit . Pt will conitnue to benefit from skilled Pt    Pt will benefit from skilled therapeutic intervention in order to improve on the following deficits Decreased strength;Decreased scar mobility;Impaired flexibility;Pain;Decreased activity tolerance;Decreased range of motion;Increased fascial restricitons   Rehab Potential Good   PT Frequency 3x / week   PT Duration 8 weeks   PT Treatment/Interventions ADLs/Self Care Home Management;Cryotherapy;Electrical Stimulation;Moist Heat;Therapeutic exercise;Therapeutic activities;Ultrasound;Neuromuscular re-education;Patient/family education;Manual techniques;Vasopneumatic Device;Taping;Passive range of motion   PT Next Visit Plan Continue to follow protocol, start resistance Dec 6th   Consulted and Agree with Plan of Care Patient        Problem List Patient Active Problem List   Diagnosis Date Noted  . Routine general medical examination at a health care facility 03/11/2011  . Elevated blood-pressure reading without diagnosis of hypertension 03/11/2011    NAUMANN-HOUEGNIFIO,Jose Howell 01/08/2015, 1:34  PM  [facility] [address], [address] [place], [place], [address] Phone: [phone]   Fax:  [phone]  Name: Jose Howell MRN: [id number] Date of Birth: 05/28/1950

## 2015-01-10 ENCOUNTER — Ambulatory Visit: Payer: No Typology Code available for payment source | Admitting: Physical Therapy

## 2015-01-10 ENCOUNTER — Encounter: Payer: Self-pay | Admitting: Physical Therapy

## 2015-01-10 DIAGNOSIS — M25611 Stiffness of right shoulder, not elsewhere classified: Secondary | ICD-10-CM

## 2015-01-10 DIAGNOSIS — R29898 Other symptoms and signs involving the musculoskeletal system: Secondary | ICD-10-CM

## 2015-01-10 DIAGNOSIS — M25511 Pain in right shoulder: Secondary | ICD-10-CM

## 2015-01-10 NOTE — Therapy (Addendum)
[facility] [address], [address] [place], [place], [address] Phone: [phone]   Fax:  [phone]  Physical Therapy Treatment  Patient Details  Name: Jose Howell MRN: [id number] Date of Birth: 1950/08/01 Referring Provider: Annell Greening, MD  Encounter Date: 01/10/2015      PT End of Session - 01/10/15 1210    Visit Number 9   Number of Visits 19   Date for PT Re-Evaluation 02/06/15   Authorization Type [employer]   Authorization Time Period 11/21/14-08/18/15   Authorization - Visit Number 9   Authorization - Number of Visits 36   PT Start Time 1147   PT Stop Time 1242   PT Time Calculation (min) 55 min   Activity Tolerance Patient tolerated treatment well   Behavior During Therapy [facility] for tasks assessed/performed      Past Medical History  Diagnosis Date  . GERD (gastroesophageal reflux disease)   . Arthritis     Past Surgical History  Procedure Laterality Date  . Umbilical hernia repair    . Radiology with anesthesia Right 10/17/2014    Procedure: MRI RIGHT SHOULDER WITHOUT;  Surgeon: Medication Radiologist, MD;  Location: [facility] OR;  Service: Radiology;  Laterality: Right;  DR. DYER/MRI    There were no vitals filed for this visit.  Visit Diagnosis:  Shoulder stiffness, right  Shoulder weakness  Pain in joint of right shoulder      Subjective Assessment - 01/10/15 1155    Subjective Per MD pt is allowed to start with resistance on December 6th   Currently in Pain? Yes   Pain Score 4    Pain Location Shoulder   Pain Orientation Right   Pain Descriptors / Indicators Aching;Sore;Tightness   Pain Type Surgical pain   Pain Onset More than a month ago   Pain Frequency Intermittent   Multiple Pain Sites No            [facility] PT Assessment - 01/10/15 0001    Observation/Other Assessments   Focus on Therapeutic Outcomes (FOTO)  58% limitation   ROM / Strength   AROM / PROM / Strength AROM   AROM    Overall AROM  Deficits   Overall AROM Comments not tested due to restrictions   AROM Assessment Site Shoulder   PROM   Overall PROM  Deficits   PROM Assessment Site Shoulder   Right/Left Shoulder Right   Right Shoulder Flexion 115 Degrees   Right Shoulder ABduction 85 Degrees   Palpation   Palpation comment surgical incision is healing well.  Raised area of edema and scar tissue over incision at anterior glenohumeral joint.  Tenderness about the glenohumeral joint.                       [facility] Adult PT Treatment/Exercise - 01/10/15 0001    Bed Mobility   Bed Mobility --  pt wishes to be Jose Howell   Posture/Postural Control   Posture/Postural Control No significant limitations   Exercises   Exercises Wrist;Shoulder   Shoulder Exercises: Supine   Flexion AAROM;Both;20 reps;10 reps  with cane   Other Supine Exercises chest press with cane 3x10   Shoulder Exercises: Seated   External Rotation AROM  right    Flexion AAROM;20 reps  with cane   Abduction AAROM;20 reps  with cane   Other Seated Exercises Biceps curls with 5# 3 x10   Shoulder Exercises: Standing   Flexion AAROM;10 reps;20 reps  to peg #15   ABduction AAROM;10 reps  with cane   Other Standing Exercises Pendulum exercises circles each side, side to sside, flexion/ext x 1 min each   Shoulder Exercises: Pulleys   Flexion 3 minutes   ABduction 3 minutes   Shoulder Exercises: ROM/Strengthening   Other ROM/Strengthening Exercises digiflex: green 3x1 minute   Modalities   Modalities Cryotherapy   Cryotherapy   Number Minutes Cryotherapy 10 Minutes   Cryotherapy Location Shoulder   Type of Cryotherapy Ice pack   Manual Therapy   Manual Therapy Soft tissue mobilization   Manual therapy comments supine PROM into flexion and scapula abduction   Passive ROM PROM to tolerance                  PT Short Term Goals - 01/08/15 1324    PT SHORT TERM GOAL #1   Title be independent in initial HEP    Time 4   Period Weeks   Status Achieved   PT SHORT TERM GOAL #2   Title demonstrate Rt shoulder PROM flexion to > or = to 120 degrees to improve mobility   Time 4   Period Weeks   Status On-going   PT SHORT TERM GOAL #3   Title report > or = to 50% use of Rt UE for light home tasks   Time 4   Period Weeks   Status On-going           PT Long Term Goals - 01/01/15 1322    PT LONG TERM GOAL #1   Title be independent in advanced HEP   Time 8   Period Weeks   Status On-going   PT LONG TERM GOAL #2   Title reduce FOTO to < or 39% limitation   Time 8   Period Weeks   Status On-going   PT LONG TERM GOAL #3   Title demonstrate > or = to 120 degrees Rt shoulder AROM to improve overhead reaching   Time 8   Period Weeks   Status On-going   PT LONG TERM GOAL #4   Title demonstrate 4-/5 Rt shoulder strength to improve endurance with use.   Time 8   Period Weeks   Status On-going   PT LONG TERM GOAL #5   Title demonstrate full Rt shoulder PROM throughout   Time 8   Period Weeks   Status On-going   PT LONG TERM GOAL #6   Title report a 75% reduction in Rt shoulder pain with use.     Time 8   Period Weeks   Status On-going               Plan - 01/10/15 1225    Clinical Impression Statement Pt continues to improve with AAROM and PROM. Pt will be allowed to start with resistive exercises next week Dec 6th, per info from last weeks MD visit. Pt will continue to benefit from skilled PT to continue to improve with ROM    Pt will benefit from skilled therapeutic intervention in order to improve on the following deficits Decreased strength;Decreased scar mobility;Impaired flexibility;Pain;Decreased activity tolerance;Decreased range of motion;Increased fascial restricitons   Rehab Potential Good   PT Frequency 3x / week   PT Duration 8 weeks   PT Treatment/Interventions ADLs/Self Care Home Management;Cryotherapy;Electrical Stimulation;Moist Heat;Therapeutic  exercise;Therapeutic activities;Ultrasound;Neuromuscular re-education;Patient/family education;Manual techniques;Vasopneumatic Device;Taping;Passive range of motion   PT Next Visit Plan Complete G-Codes, Foto, Continue to follow protocol, start resistance Dec 6th  Consulted and Agree with Plan of Care Patient          G-Codes - 02/06/2015 1409    Functional Assessment Tool Used FOTO: 58% limitation   Functional Limitation Other PT primary   Other PT Primary Current Status (K4401) At least 40 percent but less than 60 percent impaired, limited or restricted   Other PT Primary Goal Status (U2725) At least 20 percent but less than 40 percent impaired, limited or restricted      Problem List Patient Active Problem List   Diagnosis Date Noted  . Routine general medical examination at a health care facility 03/11/2011  . Elevated blood-pressure reading without diagnosis of hypertension 03/11/2011   Gelila Well Naumann-Houegnifio, PTA Feb 06, 2015 4:37 PM  Physical Therapy Progress Note  Dates of Reporting Period: 12/12/2014 to 02-06-15  Objective Reports of Subjective Statement: Pt reports that he is improving daily.  Slow progress due to post-op protocol.    Objective Measurements: see above for ROM  Goal Update: See above for goal status.    Plan: Continue to follow post-op protocol to improve shoulder strength and AROM and functional use safely.  Reason Skilled Services are Required: Pt with limited shoulder AROM, strength and use s/p surgery.  Pt will benefit from skilled PT safe progression of strength, AROM and use of UE.     TAKACS,KELLY PT 2015/02/06, 2:17 PM  [facility] [address] [place], [place], [address] Phone: [phone]   Fax:  [phone]  Name: Jose Howell MRN: [id number] Date of Birth: 08-23-1950

## 2015-01-12 ENCOUNTER — Ambulatory Visit: Payer: No Typology Code available for payment source | Attending: Orthopaedic Surgery | Admitting: Physical Therapy

## 2015-01-12 ENCOUNTER — Encounter: Payer: Self-pay | Admitting: Physical Therapy

## 2015-01-12 DIAGNOSIS — M25511 Pain in right shoulder: Secondary | ICD-10-CM | POA: Insufficient documentation

## 2015-01-12 DIAGNOSIS — M25611 Stiffness of right shoulder, not elsewhere classified: Secondary | ICD-10-CM | POA: Diagnosis present

## 2015-01-12 DIAGNOSIS — R29898 Other symptoms and signs involving the musculoskeletal system: Secondary | ICD-10-CM | POA: Diagnosis present

## 2015-01-12 NOTE — Therapy (Signed)
[facility] [address] [place], [place], [address] Phone: [phone]   Fax:  [phone]  Physical Therapy Treatment  Patient Details  Name: Jose Howell MRN: [id number] Date of Birth: Aug 03, 1950 Referring Provider: Annell Greening, MD  Encounter Date: 01/12/2015      PT End of Session - 01/12/15 1141    Visit Number 10   Number of Visits 19   Date for PT Re-Evaluation 02/06/15   Authorization Type [employer]   Authorization Time Period 11/21/14-08/18/15   Authorization - Visit Number 10   Authorization - Number of Visits 36   PT Start Time 1100   PT Stop Time 1152   PT Time Calculation (min) 52 min   Activity Tolerance Patient tolerated treatment well   Behavior During Therapy [facility] for tasks assessed/performed      Past Medical History  Diagnosis Date  . GERD (gastroesophageal reflux disease)   . Arthritis     Past Surgical History  Procedure Laterality Date  . Umbilical hernia repair    . Radiology with anesthesia Right 10/17/2014    Procedure: MRI RIGHT SHOULDER WITHOUT;  Surgeon: Medication Radiologist, MD;  Location: [facility] OR;  Service: Radiology;  Laterality: Right;  DR. DYER/MRI    There were no vitals filed for this visit.  Visit Diagnosis:  Shoulder stiffness, right  Shoulder weakness  Pain in joint of right shoulder      Subjective Assessment - 01/12/15 1107    Subjective Per MD patient is allowed to start with resistance on December 6th    Pertinent History Rt shoulder scope with open biceps tenodesis on 10/17/2014.    Currently in Pain? Yes   Pain Score 6    Pain Location Shoulder   Pain Orientation Right   Pain Descriptors / Indicators Aching;Sore;Tightness   Pain Type Surgical pain   Pain Onset More than a month ago   Multiple Pain Sites No                         [facility] Adult PT Treatment/Exercise - 01/12/15 0001    Bed Mobility   Bed Mobility --  pt wishes to be  called Jose Howell   Posture/Postural Control   Posture/Postural Control No significant limitations   Exercises   Exercises Wrist;Shoulder   Shoulder Exercises: Supine   Flexion AAROM;Both;20 reps;10 reps  with cane   Other Supine Exercises chest press with cane 3x10   Shoulder Exercises: Seated   External Rotation AROM  right   Flexion AAROM;20 reps  with cane   Abduction AAROM;20 reps  with cane   Other Seated Exercises Biceps curls with 5# 3 x10   Shoulder Exercises: Standing   Flexion AAROM;10 reps;20 reps  to peg 15   ABduction AAROM;10 reps  with cane   Other Standing Exercises Pendulum exercises circles each side, side to sside, flexion/ext x 1 min each   Shoulder Exercises: Pulleys   Flexion 3 minutes   ABduction 3 minutes   Modalities   Modalities Cryotherapy   Cryotherapy   Number Minutes Cryotherapy 10 Minutes   Cryotherapy Location Shoulder   Type of Cryotherapy Ice pack   Manual Therapy   Manual Therapy Soft tissue mobilization   Manual therapy comments supine and left sidelying to release subscapularis, PROM into flexion    Passive ROM PROM to tolerance  PT Short Term Goals - 01/08/15 1324    PT SHORT TERM GOAL #1   Title be independent in initial HEP   Time 4   Period Weeks   Status Achieved   PT SHORT TERM GOAL #2   Title demonstrate Rt shoulder PROM flexion to > or = to 120 degrees to improve mobility   Time 4   Period Weeks   Status On-going   PT SHORT TERM GOAL #3   Title report > or = to 50% use of Rt UE for light home tasks   Time 4   Period Weeks   Status On-going           PT Long Term Goals - 01/01/15 1322    PT LONG TERM GOAL #1   Title be independent in advanced HEP   Time 8   Period Weeks   Status On-going   PT LONG TERM GOAL #2   Title reduce FOTO to < or 39% limitation   Time 8   Period Weeks   Status On-going   PT LONG TERM GOAL #3   Title demonstrate > or = to 120 degrees Rt shoulder AROM to  improve overhead reaching   Time 8   Period Weeks   Status On-going   PT LONG TERM GOAL #4   Title demonstrate 4-/5 Rt shoulder strength to improve endurance with use.   Time 8   Period Weeks   Status On-going   PT LONG TERM GOAL #5   Title demonstrate full Rt shoulder PROM throughout   Time 8   Period Weeks   Status On-going   PT LONG TERM GOAL #6   Title report a 75% reduction in Rt shoulder pain with use.     Time 8   Period Weeks   Status On-going               Plan - 01/12/15 1141    Clinical Impression Statement Pt was able to perform his AAROM despite his complain of pain in right shoulder. Pt with palpaple tenderness and restriction in Rt subscapularis what restricts his abduction. Pt will continue to benefit from skilled PT.    Pt will benefit from skilled therapeutic intervention in order to improve on the following deficits Decreased strength;Decreased scar mobility;Impaired flexibility;Pain;Decreased activity tolerance;Decreased range of motion;Increased fascial restricitons   Rehab Potential Good   PT Frequency 3x / week   PT Duration 8 weeks   PT Treatment/Interventions ADLs/Self Care Home Management;Cryotherapy;Electrical Stimulation;Moist Heat;Therapeutic exercise;Therapeutic activities;Ultrasound;Neuromuscular re-education;Patient/family education;Manual techniques;Vasopneumatic Device;Taping;Passive range of motion   PT Next Visit Plan Start with gentle restirction exercises, Manual therpay: continue to release subscapularis.   Consulted and Agree with Plan of Care Patient        Problem List Patient Active Problem List   Diagnosis Date Noted  . Routine general medical examination at a health care facility 03/11/2011  . Elevated blood-pressure reading without diagnosis of hypertension 03/11/2011    Howell,Jose Mathieu PTA 01/12/2015, 11:49 AM  [facility] [address], [address] [place], [place], [address] Phone: [phone]   Fax:  [phone]  Name: Jose MastLuis C Howell MRN: [id number] Date of Birth: 1950-12-21

## 2015-01-15 ENCOUNTER — Ambulatory Visit: Payer: No Typology Code available for payment source | Admitting: Physical Therapy

## 2015-01-15 ENCOUNTER — Encounter: Payer: Self-pay | Admitting: Physical Therapy

## 2015-01-15 DIAGNOSIS — M25611 Stiffness of right shoulder, not elsewhere classified: Secondary | ICD-10-CM | POA: Diagnosis not present

## 2015-01-15 DIAGNOSIS — M25511 Pain in right shoulder: Secondary | ICD-10-CM

## 2015-01-15 DIAGNOSIS — R29898 Other symptoms and signs involving the musculoskeletal system: Secondary | ICD-10-CM

## 2015-01-15 NOTE — Therapy (Signed)
[name] [address], [address] [place], [place], [address] Phone: [phone]   Fax:  [phone]  Physical Therapy Treatment  Patient Details  Name: Jose MastLuis C Howell MRN: [id number] Date of Birth: Oct 13, 1950 Referring Provider: Annell GreeningYates, Mark, MD  Encounter Date: 01/15/2015      PT End of Session - 01/15/15 1211    Visit Number 11   Number of Visits 19   Date for PT Re-Evaluation 02/06/15   Authorization Type [employer]   Authorization Time Period 11/21/14-08/18/15   Authorization - Visit Number 11   PT Start Time 1147   PT Stop Time 1246   PT Time Calculation (min) 59 min   Activity Tolerance Patient tolerated treatment well   Behavior [facility] Therapy [facility] for tasks assessed/performed      Past Medical History  Diagnosis Date  . GERD (gastroesophageal reflux disease)   . Arthritis     Past Surgical History  Procedure Laterality Date  . Umbilical hernia repair    . Radiology with anesthesia Right 10/17/2014    Procedure: MRI RIGHT SHOULDER WITHOUT;  Surgeon: Medication Radiologist, MD;  Location: [facility] OR;  Service: Radiology;  Laterality: Right;  DR. DYER/MRI    There were no vitals filed for this visit.  Visit Diagnosis:  Shoulder stiffness, right  Shoulder weakness  Pain in joint of right shoulder      Subjective Assessment - 01/15/15 1209    Subjective Pt is dissapointet due to shoulder feels stiff , decreased ROM and pain rated as 5/10 in deltoid area.    Currently in Pain? Yes   Pain Score 5    Pain Location Shoulder   Pain Orientation Right   Pain Descriptors / Indicators Aching;Sore;Tightness   Pain Type Surgical pain   Pain Onset More than a month ago   Pain Frequency Intermittent   Multiple Pain Sites No                         [facility] Adult PT Treatment/Exercise - 01/15/15 0001    Bed Mobility   Bed Mobility --  wishes to be calle [name]   Posture/Postural Control   Posture/Postural Control No significant limitations   Exercises   Exercises Wrist;Shoulder   Shoulder Exercises: Supine   Flexion AAROM;Both;20 reps;10 reps  with cane   Shoulder Exercises: Seated   External Rotation AROM  right   Flexion AAROM;20 reps  tableslides for warming up and b/w exercises   Shoulder Exercises: Standing   Flexion Other (comment)  walladder to peg 19 2 x 10   ABduction Other (comment)  walladder 2 x10 to peg 9, pt with improved movement today   Other Standing Exercises Pendulum exercises circles each side, side to sside, flexion/ext x 1 min each   Other Standing Exercises Overdoor reach on 8" +4"step (12") x10, able to lowered to 8" & 2" box  pt able to reach corner of doorframe    Shoulder Exercises: Pulleys   Flexion 3 minutes   ABduction 3 minutes  in standing   Modalities   Modalities Moist Heat   Moist Heat Therapy   Number Minutes Moist Heat 15 Minutes   Moist Heat Location Shoulder  right   Cryotherapy   Number Minutes Cryotherapy --   Cryotherapy Location --   Manual Therapy   Manual Therapy Soft tissue mobilization   Manual therapy comments in left sidelying  to subscapularis, lattisimus & teres minor/major with PROM  ito pt tolerance into abduction                  PT Short Term Goals - 01/15/15 1356    PT SHORT TERM GOAL #1   Title be independent in initial HEP   Time 4   Period Weeks   Status Achieved   PT SHORT TERM GOAL #2   Title demonstrate Rt shoulder PROM flexion to > or = to 120 degrees to improve mobility   Time 4   Period Weeks   Status Achieved   PT SHORT TERM GOAL #3   Title report > or = to 50% use of Rt UE for light home tasks   Time 4   Period Weeks   Status On-going           PT Long Term Goals - 01/01/15 1322    PT LONG TERM GOAL #1   Title be independent in advanced HEP   Time 8   Period Weeks   Status On-going   PT LONG TERM GOAL #2   Title reduce FOTO to < or 39% limitation   Time 8    Period Weeks   Status On-going   PT LONG TERM GOAL #3   Title demonstrate > or = to 120 degrees Rt shoulder AROM to improve overhead reaching   Time 8   Period Weeks   Status On-going   PT LONG TERM GOAL #4   Title demonstrate 4-/5 Rt shoulder strength to improve endurance with use.   Time 8   Period Weeks   Status On-going   PT LONG TERM GOAL #5   Title demonstrate full Rt shoulder PROM throughout   Time 8   Period Weeks   Status On-going   PT LONG TERM GOAL #6   Title report a 75% reduction in Rt shoulder pain with use.     Time 8   Period Weeks   Status On-going               Plan - 01/15/15 1216    Clinical Impression Statement Pt presents with increased AAROM as noticed with wallladder activities and pullies. Pt wit palpaple tenderness and restriction in Rt subscapularis, lattisimus and terres minor. Pt will continue to benefit from skilled PT to release Muslce tightness to improve ROM right shoulder   Pt will benefit from skilled therapeutic intervention in order to improve on the following deficits Decreased strength;Decreased scar mobility;Impaired flexibility;Pain;Decreased activity tolerance;Decreased range of motion;Increased fascial restricitons   Rehab Potential Good   PT Frequency 3x / week   PT Duration 8 weeks   PT Treatment/Interventions ADLs/Self Care Home Management;Cryotherapy;Electrical Stimulation;Moist Heat;Therapeutic exercise;Therapeutic activities;Ultrasound;Neuromuscular re-education;Patient/family education;Manual techniques;Vasopneumatic Device;Taping;Passive range of motion   PT Next Visit Plan Start with gentle restirction exercises, Manual therpay: continue to release subscapularis, teres major and lattissimus, see if heat helped   Consulted and Agree with Plan of Care Patient        Problem List Patient Active Problem List   Diagnosis Date Noted  . Routine general medical examination at a health care facility 03/11/2011  . Elevated  blood-pressure reading without diagnosis of hypertension 03/11/2011    NAUMANN-HOUEGNIFIO,[name] PTA 01/15/2015, 2:22 PM  [facility] [address], [address] [place], [place], [address] Phone: [phone]   Fax:  [phone]  Name: ELNATHAN FULFORD MRN: [id number] Date of Birth: 10-10-1950

## 2015-01-17 ENCOUNTER — Encounter: Payer: Managed Care, Other (non HMO) | Admitting: Physical Therapy

## 2015-01-19 ENCOUNTER — Encounter: Payer: Self-pay | Admitting: Physical Therapy

## 2015-01-19 ENCOUNTER — Ambulatory Visit: Payer: No Typology Code available for payment source | Admitting: Physical Therapy

## 2015-01-19 DIAGNOSIS — M25511 Pain in right shoulder: Secondary | ICD-10-CM

## 2015-01-19 DIAGNOSIS — M25611 Stiffness of right shoulder, not elsewhere classified: Secondary | ICD-10-CM | POA: Diagnosis not present

## 2015-01-19 DIAGNOSIS — R29898 Other symptoms and signs involving the musculoskeletal system: Secondary | ICD-10-CM

## 2015-01-19 NOTE — Therapy (Signed)
[facility] [address], [address] [place], [place], [address] Phone: [phone]   Fax:  [phone]  Physical Therapy Treatment  Patient Details  Name: Jose Howell MRN: [id number] Date of Birth: January 02, 1951 Referring Provider: Annell GreeningYates, Mark, MD  Encounter Date: 01/19/2015      PT End of Session - 01/19/15 1048    Visit Number 12   Number of Visits 19   Date for PT Re-Evaluation 02/06/15   Authorization Type [employer]   Authorization Time Period 11/21/14-08/18/15   Authorization - Visit Number 12   Authorization - Number of Visits 36   PT Start Time 1013   PT Stop Time 1110   PT Time Calculation (min) 57 min   Activity Tolerance Patient tolerated treatment well   Behavior During Therapy [facility] for tasks assessed/performed      Past Medical History  Diagnosis Date  . GERD (gastroesophageal reflux disease)   . Arthritis     Past Surgical History  Procedure Laterality Date  . Umbilical hernia repair    . Radiology with anesthesia Right 10/17/2014    Procedure: MRI RIGHT SHOULDER WITHOUT;  Surgeon: Medication Radiologist, MD;  Location: [facility] OR;  Service: Radiology;  Laterality: Right;  DR. DYER/MRI    There were no vitals filed for this visit.  Visit Diagnosis:  Shoulder stiffness, right  Shoulder weakness  Pain in joint of right shoulder      Subjective Assessment - 01/19/15 1029    Subjective Pt reports he notices his shoulder has improved movement, but pain is still a 5/10 in Rt shoulder   Pertinent History Rt shoulder scope with open biceps tenodesis on 10/17/2014.    Patient Stated Goals rehab shoulder   Currently in Pain? Yes   Pain Score 5    Pain Location Shoulder   Pain Orientation Right   Pain Descriptors / Indicators Aching;Sore;Tightness   Pain Type Surgical pain   Pain Onset More than a month ago   Pain Frequency Intermittent   Multiple Pain Sites No                          [facility] Adult PT Treatment/Exercise - 01/19/15 0001    Bed Mobility   Bed Mobility --  wishes to be called Jose Howell   Posture/Postural Control   Posture/Postural Control No significant limitations   Shoulder Exercises: Standing   Other Standing Exercises Pendulum exercises circles each side, side to sside, flexion/ext x 1 min each  [place] with yellow t-band 2 x10 pt needs reminders for pac   Other Standing Exercises Overdoor reach on 8" +4"step (12") x10, able to lowered to 8" & 2" box  pt able to reach corner of doorframe    Shoulder Exercises: Pulleys   Flexion Other (comment)  4minutes   ABduction Other (comment)  4minutes   Modalities   Modalities Moist Heat   Moist Heat Therapy   Number Minutes Moist Heat 15 Minutes   Moist Heat Location Shoulder  right                PT Education - 01/19/15 1048    Education provided Yes   Education Details [place] with yellow t-band   Person(s) Educated Patient   Methods Explanation;Demonstration;Handout   Comprehension Verbalized understanding;Returned demonstration          PT Short Term Goals - 01/15/15 1356    PT SHORT TERM GOAL #1   Title be  independent in initial HEP   Time 4   Period Weeks   Status Achieved   PT SHORT TERM GOAL #2   Title demonstrate Rt shoulder PROM flexion to > or = to 120 degrees to improve mobility   Time 4   Period Weeks   Status Achieved   PT SHORT TERM GOAL #3   Title report > or = to 50% use of Rt UE for light home tasks   Time 4   Period Weeks   Status On-going           PT Long Term Goals - 01/01/15 1322    PT LONG TERM GOAL #1   Title be independent in advanced HEP   Time 8   Period Weeks   Status On-going   PT LONG TERM GOAL #2   Title reduce FOTO to < or 39% limitation   Time 8   Period Weeks   Status On-going   PT LONG TERM GOAL #3   Title demonstrate > or = to 120 degrees Rt shoulder AROM to improve overhead reaching   Time 8   Period Weeks   Status On-going    PT LONG TERM GOAL #4   Title demonstrate 4-/5 Rt shoulder strength to improve endurance with use.   Time 8   Period Weeks   Status On-going   PT LONG TERM GOAL #5   Title demonstrate full Rt shoulder PROM throughout   Time 8   Period Weeks   Status On-going   PT LONG TERM GOAL #6   Title report a 75% reduction in Rt shoulder pain with use.     Time 8   Period Weeks   Status On-going               Plan - 01/19/15 1056    Clinical Impression Statement Pt continues to improve with his AAROM and AROM in right shoulder. Pt with restrictions in subscapularis, lattisimus and terres minor. Pt will continue to benefit from skilled PT to release muscle tightness to improve ROM and strength.   Pt will benefit from skilled therapeutic intervention in order to improve on the following deficits Decreased strength;Decreased scar mobility;Impaired flexibility;Pain;Decreased activity tolerance;Decreased range of motion;Increased fascial restricitons   Rehab Potential Good   PT Frequency 3x / week   PT Duration 8 weeks   PT Treatment/Interventions ADLs/Self Care Home Management;Cryotherapy;Electrical Stimulation;Moist Heat;Therapeutic exercise;Therapeutic activities;Ultrasound;Neuromuscular re-education;Patient/family education;Manual techniques;Vasopneumatic Device;Taping;Passive range of motion   PT Next Visit Plan Start with gentle restirction exercises, Manual therpay: continue to release subscapularis, teres major and lattissimus, see if heat helped   Consulted and Agree with Plan of Care Patient        Problem List Patient Active Problem List   Diagnosis Date Noted  . Routine general medical examination at a health care facility 03/11/2011  . Elevated blood-pressure reading without diagnosis of hypertension 03/11/2011    NAUMANN-HOUEGNIFIO,Tabathia Knoche PTA 01/19/2015, 11:04 AM  [facility] [address] [place],  [place], [address] Phone: [phone]   Fax:  [phone]  Name: Jose Howell MRN: [id number] Date of Birth: 27-May-1950

## 2015-01-19 NOTE — Patient Instructions (Addendum)
Strengthening: Resisted Internal Rotation          Each exercise x 10  Hold for 3 sec  Perform 2 sets, perform 2-3 x per day       Hold tubing in left hand, elbow at side and forearm out. Rotate forearm in across body. Repeat ____ times per set. Do ____ sets per session. Do ____ sessions per day.  http://orth.exer.us/830      Copyright  VHI. All rights reserved.  Strengthening: Resisted Flexion   Hold tubing with left arm at side. Pull forward and up. Move shoulder through pain-free range of motion. Repeat ____ times per set. Do ____ sets per session. Do ____ sessions per day.  http://orth.exer.us/824   Copyright  VHI. All rights reserved.  Strengthening: Resisted Extension   Hold tubing in right hand, arm forward. Pull arm back, elbow straight. Repeat ____ times per set. Do ____ sets per session. Do ____ sessions per day.  http://orth.exer.us/832   Copyright  VHI. All rights reserved.   Low Row: Single Arm   Face anchor in stride stance. Palm up, pull arm back while squeezing shoulder blades together. Repeat 10__ times per set. Repeat with other arm. Do _2-3_ sets per session. Do 3__ sessions per week. Anchor Height: Waist  http://tub.exer.us/71   Copyright  VHI. All rights reserved.  Press: Thumb Up (Single Arm)   Face away from anchor in stride stance, leg forward opposite exercising arm. Press arm forward with thumb up. Repeat 10 times per set.  Do _2-3_ sets per session. Do _3_ sessions per week. Anchor Height: Chest  http://tub.exer.us/17   Copyright  VHI. All rights reserved.  Rotation: External (Single Arm)   Keep elbow at your body (not seen in picture) Side toward anchor in shoulder width stance with elbow bent to 90, arm across mid-section. Thumb up, pull arm away from body, keeping elbow bent. Repeat _10_ times per set. Repeat with other arm. Do _2-3_ sets per session. Do _3_ sessions per week. Anchor Height: Waist  http://tub.exer.us/115

## 2015-01-23 ENCOUNTER — Ambulatory Visit: Payer: No Typology Code available for payment source | Admitting: Physical Therapy

## 2015-01-23 ENCOUNTER — Encounter: Payer: Self-pay | Admitting: Physical Therapy

## 2015-01-23 DIAGNOSIS — M25511 Pain in right shoulder: Secondary | ICD-10-CM

## 2015-01-23 DIAGNOSIS — M25611 Stiffness of right shoulder, not elsewhere classified: Secondary | ICD-10-CM | POA: Diagnosis not present

## 2015-01-23 DIAGNOSIS — R29898 Other symptoms and signs involving the musculoskeletal system: Secondary | ICD-10-CM

## 2015-01-23 NOTE — Therapy (Signed)
[facility] [address], [address] [place], [place], [address] Phone: [phone]   Fax:  [phone]  Physical Therapy Treatment  Patient Details  Name: Jose Howell MRN: [id number] Date of Birth: 01/11/1951 Referring Provider: Annell GreeningYates, Mark, MD  Encounter Date: 01/23/2015      PT End of Session - 01/23/15 1108    Visit Number 13   Number of Visits 19   Date for PT Re-Evaluation 02/06/15   Authorization Type [employer]   Authorization Time Period 11/21/14-08/18/15   Authorization - Visit Number 13   Authorization - Number of Visits 36   PT Start Time 1100   PT Stop Time 1159   PT Time Calculation (min) 59 min   Activity Tolerance Patient tolerated treatment well   Behavior During Therapy [facility] for tasks assessed/performed      Past Medical History  Diagnosis Date  . GERD (gastroesophageal reflux disease)   . Arthritis     Past Surgical History  Procedure Laterality Date  . Umbilical hernia repair    . Radiology with anesthesia Right 10/17/2014    Procedure: MRI RIGHT SHOULDER WITHOUT;  Surgeon: Medication Radiologist, MD;  Location: [facility] OR;  Service: Radiology;  Laterality: Right;  DR. DYER/MRI    There were no vitals filed for this visit.  Visit Diagnosis:  Shoulder stiffness, right  Shoulder weakness  Pain in joint of right shoulder      Subjective Assessment - 01/23/15 1107    Subjective Pain in Rt shoulder is rated as 5/10   Currently in Pain? Yes   Pain Score 5    Pain Location Shoulder   Pain Orientation Right   Pain Type Surgical pain   Pain Onset More than a month ago   Pain Frequency Intermittent   Multiple Pain Sites No                         [facility] Adult PT Treatment/Exercise - 01/23/15 0001    Bed Mobility   Bed Mobility --  pt wishes to be called [name]   Posture/Postural Control   Posture/Postural Control No significant limitations   Exercises   Exercises Shoulder    Shoulder Exercises: Seated   Other Seated Exercises chestpress 15# 1 x10  horizontal and vertical grip   Shoulder Exercises: Prone   Extension Strengthening;20 reps;Weights  1.5#   Other Prone Exercises rows with 1.5#   Shoulder Exercises: Sidelying   Flexion Strengthening;20 reps;Weights  1.5# added   ABduction Strengthening;20 reps;Weights  1.5# added   Shoulder Exercises: Standing   Flexion AAROM  10 to peg #19   ABduction AAROM;10 reps  to peg #12   Other Standing Exercises Pendulum exercises circles each side, side to sside, flexion/ext x 1 min each   Other Standing Exercises Overdoor reach on 8" +4"step (12") x10, able to lowered to 8" & 2" box  pt able to reach corner of doorframe    Shoulder Exercises: Pulleys   Flexion 3 minutes   ABduction 3 minutes   Shoulder Exercises: ROM/Strengthening   Rebounder UBE L8 x 6min (3/3)                  PT Short Term Goals - 01/23/15 1111    PT SHORT TERM GOAL #1   Title be independent in initial HEP   Time 4   Period Weeks   Status Achieved   PT SHORT TERM GOAL #2  Title demonstrate Rt shoulder PROM flexion to > or = to 120 degrees to improve mobility   Time 4   Period Weeks   Status Achieved   PT SHORT TERM GOAL #3   Title report > or = to 50% use of Rt UE for light home tasks   Time 4   Period Weeks   Status Achieved           PT Long Term Goals - 01/23/15 1113    PT LONG TERM GOAL #1   Title be independent in advanced HEP   Time 8   Period Weeks   Status On-going   PT LONG TERM GOAL #2   Title reduce FOTO to < or 39% limitation   Time 8   Period Weeks   Status On-going   PT LONG TERM GOAL #3   Title demonstrate > or = to 120 degrees Rt shoulder AROM to improve overhead reaching   Time 8   Period Weeks   Status On-going   PT LONG TERM GOAL #4   Title demonstrate 4-/5 Rt shoulder strength to improve endurance with use.   Time 8   Period Weeks   Status On-going   PT LONG TERM GOAL #5    Title demonstrate full Rt shoulder PROM throughout   Time 8   Period Weeks   Status On-going   PT LONG TERM GOAL #6   Title report a 75% reduction in Rt shoulder pain with use.     Time 8   Period Weeks   Status On-going               Plan - 01/23/15 1108    Clinical Impression Statement Pt able to tolerate strengthening exercises well today. AROM in degrees: Rt shoulder flexion 103, Abduction 75, in 85 degrees of shoulder abduction IR 42, ER 50. Pt will continue to benefit from skilled PT to improve ROM and strength.   Pt will benefit from skilled therapeutic intervention in order to improve on the following deficits Decreased strength;Decreased scar mobility;Impaired flexibility;Pain;Decreased activity tolerance;Decreased range of motion;Increased fascial restricitons   Rehab Potential Good   PT Frequency 3x / week   PT Duration 8 weeks   PT Treatment/Interventions ADLs/Self Care Home Management;Cryotherapy;Electrical Stimulation;Moist Heat;Therapeutic exercise;Therapeutic activities;Ultrasound;Neuromuscular re-education;Patient/family education;Manual techniques;Vasopneumatic Device;Taping;Passive range of motion   PT Next Visit Plan Conntinue with UBE and strengthening. Continue to release subscapularis, teres major and lattissimus, continue with heat   Consulted and Agree with Plan of Care Patient        Problem List Patient Active Problem List   Diagnosis Date Noted  . Routine general medical examination at a health care facility 03/11/2011  . Elevated blood-pressure reading without diagnosis of hypertension 03/11/2011    NAUMANN-HOUEGNIFIO,Jurell Basista PTA 01/23/2015, 1:24 PM  [facility] [address] [place], [place], [address] Phone: [phone]   Fax:  [phone]  Name: Jose Howell MRN: [id number] Date of Birth: Feb 02, 1951

## 2015-01-24 ENCOUNTER — Encounter: Payer: Managed Care, Other (non HMO) | Admitting: Physical Therapy

## 2015-01-26 ENCOUNTER — Encounter: Payer: Managed Care, Other (non HMO) | Admitting: Physical Therapy

## 2015-01-30 ENCOUNTER — Encounter: Payer: Self-pay | Admitting: Physical Therapy

## 2015-01-30 ENCOUNTER — Ambulatory Visit: Payer: No Typology Code available for payment source | Admitting: Physical Therapy

## 2015-01-30 DIAGNOSIS — M25611 Stiffness of right shoulder, not elsewhere classified: Secondary | ICD-10-CM | POA: Diagnosis not present

## 2015-01-30 DIAGNOSIS — M25511 Pain in right shoulder: Secondary | ICD-10-CM

## 2015-01-30 DIAGNOSIS — R29898 Other symptoms and signs involving the musculoskeletal system: Secondary | ICD-10-CM

## 2015-01-30 NOTE — Therapy (Signed)
[facility] [address] [place], [place], [address] Phone: [phone]   Fax:  [phone]  Physical Therapy Treatment  Patient Details  Name: Jose Howell MRN: [id number] Date of Birth: 1950/08/09 Referring Provider: Annell Greening, MD  Encounter Date: 01/30/2015      PT End of Session - 01/30/15 1252    Visit Number 14   Number of Visits 19   Date for PT Re-Evaluation 02/06/15   Authorization Type [employer]   Authorization Time Period 11/21/14-08/18/15   Authorization - Visit Number 14   Authorization - Number of Visits 36   PT Start Time 1100   PT Stop Time 1200   PT Time Calculation (min) 60 min   Activity Tolerance Patient tolerated treatment well   Behavior During Therapy [facility] for tasks assessed/performed      Past Medical History  Diagnosis Date  . GERD (gastroesophageal reflux disease)   . Arthritis     Past Surgical History  Procedure Laterality Date  . Umbilical hernia repair    . Radiology with anesthesia Right 10/17/2014    Procedure: MRI RIGHT SHOULDER WITHOUT;  Surgeon: Medication Radiologist, MD;  Location: [facility] OR;  Service: Radiology;  Laterality: Right;  DR. DYER/MRI    There were no vitals filed for this visit.  Visit Diagnosis:  Shoulder stiffness, right  Shoulder weakness  Pain in joint of right shoulder      Subjective Assessment - 01/30/15 1113    Subjective Pain in Rt shoulder rated as 5/10, may be due to cold weather   Pertinent History Rt shoulder scope with open biceps tenodesis on 10/17/2014.    Patient Stated Goals rehab shoulder   Currently in Pain? Yes   Pain Score 5    Pain Location Shoulder   Pain Orientation Right   Pain Descriptors / Indicators Aching;Sore;Tightness   Pain Type Surgical pain   Pain Onset More than a month ago   Pain Frequency Intermittent   Aggravating Factors  night time moving the arm   Pain Relieving Factors not moving the arm, medication  at times   Multiple Pain Sites No                         [facility] Adult PT Treatment/Exercise - 01/30/15 0001    Bed Mobility   Bed Mobility --  likes to be called [name]   Posture/Postural Control   Posture/Postural Control No significant limitations   Exercises   Exercises Shoulder   Shoulder Exercises: Seated   Other Seated Exercises chestpress 23# 1 x10  with horizontal and vertical grip   Shoulder Exercises: Prone   Extension Strengthening;20 reps;Weights  with 2.5#   Other Prone Exercises rows with 2.5#   Shoulder Exercises: Sidelying   Flexion Strengthening;20 reps;Weights  1.5# added   ABduction Strengthening;20 reps;Weights  1.5# added   Shoulder Exercises: Standing   Flexion AAROM  walladder to peg #19   ABduction AAROM;10 reps  to peg # 12   Other Standing Exercises Pendulum exercises circles each side, side to sside, flexion/ext x 1 min each   Other Standing Exercises Overdoor reach on 8" +4"step (12") x10, able to lowered to 8" & 2" box  pt able to reach corner of doorframe    Shoulder Exercises: Pulleys   Flexion 3 minutes   ABduction 3 minutes   Shoulder Exercises: ROM/Strengthening   Rebounder UBE L8 x (3/3)   Modalities  Modalities Moist Heat   Moist Heat Therapy   Number Minutes Moist Heat 15 Minutes   Moist Heat Location Shoulder  under axilla   Cryotherapy   Number Minutes Cryotherapy 10 Minutes   Cryotherapy Location Shoulder   Type of Cryotherapy Ice pack  on top of shoulder   Manual Therapy   Manual Therapy Soft tissue mobilization   Manual therapy comments in left sidelying  to subscapularis, lattisimus & teres minor/major with PROM ito pt tolerance into abduction                  PT Short Term Goals - 01/23/15 1111    PT SHORT TERM GOAL #1   Title be independent in initial HEP   Time 4   Period Weeks   Status Achieved   PT SHORT TERM GOAL #2   Title demonstrate Rt shoulder PROM flexion to > or = to 120  degrees to improve mobility   Time 4   Period Weeks   Status Achieved   PT SHORT TERM GOAL #3   Title report > or = to 50% use of Rt UE for light home tasks   Time 4   Period Weeks   Status Achieved           PT Long Term Goals - 01/30/15 1255    PT LONG TERM GOAL #1   Title be independent in advanced HEP   Time 8   Period Weeks   Status On-going   PT LONG TERM GOAL #2   Title reduce FOTO to < or 39% limitation   Time 8   Period Weeks   Status On-going   PT LONG TERM GOAL #3   Title demonstrate > or = to 120 degrees Rt shoulder AROM to improve overhead reaching   Time 8   Period Weeks   Status On-going   PT LONG TERM GOAL #4   Title demonstrate 4-/5 Rt shoulder strength to improve endurance with use.   Time 8   Period Weeks   Status On-going   PT LONG TERM GOAL #5   Title demonstrate full Rt shoulder PROM throughout   Time 8   Period Weeks   Status On-going   PT LONG TERM GOAL #6   Title report a 75% reduction in Rt shoulder pain with use.     Period Weeks   Status On-going               Plan - 01/30/15 1252    Clinical Impression Statement Pt able to increase weight with chestpress, prone position and abduction. Pt will continue to improve with ROM, strength and endurance.   Pt will benefit from skilled therapeutic intervention in order to improve on the following deficits Decreased strength;Decreased scar mobility;Impaired flexibility;Pain;Decreased activity tolerance;Decreased range of motion;Increased fascial restricitons   Rehab Potential Good   PT Frequency 3x / week   PT Duration 8 weeks   PT Treatment/Interventions ADLs/Self Care Home Management;Cryotherapy;Electrical Stimulation;Moist Heat;Therapeutic exercise;Therapeutic activities;Ultrasound;Neuromuscular re-education;Patient/family education;Manual techniques;Vasopneumatic Device;Taping;Passive range of motion   PT Next Visit Plan Conntinue with UBE and strengthening. Continue to release  subscapularis, teres major and lattissimus, continue with heat for release of muscle tightness subscapularis and ice on top of schoulder for pain   Consulted and Agree with Plan of Care Patient        Problem List Patient Active Problem List   Diagnosis Date Noted  . Routine general medical examination at a health care facility 03/11/2011  .  Elevated blood-pressure reading without diagnosis of hypertension 03/11/2011    NAUMANN-HOUEGNIFIO,Ajdin Macke PTA 01/30/2015, 1:01 PM  [facility] [address] [place], [place], [address] Phone: [phone]   Fax:  [phone]  Name: DONNIVAN VILLENA MRN: [id number] Date of Birth: August 19, 1950

## 2015-02-02 ENCOUNTER — Encounter: Payer: Self-pay | Admitting: Physical Therapy

## 2015-02-02 ENCOUNTER — Ambulatory Visit: Payer: No Typology Code available for payment source | Admitting: Physical Therapy

## 2015-02-02 DIAGNOSIS — R29898 Other symptoms and signs involving the musculoskeletal system: Secondary | ICD-10-CM

## 2015-02-02 DIAGNOSIS — M25511 Pain in right shoulder: Secondary | ICD-10-CM

## 2015-02-02 DIAGNOSIS — M25611 Stiffness of right shoulder, not elsewhere classified: Secondary | ICD-10-CM | POA: Diagnosis not present

## 2015-02-02 NOTE — Therapy (Signed)
[facility] [address], [address] [place], [place], [address] Phone: [phone]   Fax:  [phone]  Physical Therapy Treatment  Patient Details  Name: Jose Howell MRN: [id number] Date of Birth: 1950/02/25 Referring Provider: Annell Greening, MD  Encounter Date: 02/02/2015      PT End of Session - 02/02/15 1042    Visit Number 15   Number of Visits 19   Date for PT Re-Evaluation 02/06/15   Authorization Type [employer]   Authorization Time Period 11/21/14-08/18/15   Authorization - Visit Number 15   Authorization - Number of Visits 36   PT Start Time 1014   PT Stop Time 1116   PT Time Calculation (min) 62 min   Activity Tolerance Patient tolerated treatment well   Behavior During Therapy [facility] for tasks assessed/performed      Past Medical History  Diagnosis Date  . GERD (gastroesophageal reflux disease)   . Arthritis     Past Surgical History  Procedure Laterality Date  . Umbilical hernia repair    . Radiology with anesthesia Right 10/17/2014    Procedure: MRI RIGHT SHOULDER WITHOUT;  Surgeon: Medication Radiologist, MD;  Location: [facility] OR;  Service: Radiology;  Laterality: Right;  DR. DYER/MRI    There were no vitals filed for this visit.  Visit Diagnosis:  Shoulder stiffness, right  Shoulder weakness  Pain in joint of right shoulder      Subjective Assessment - 02/02/15 1039    Subjective Pt reports his pain as 8-9/10, facial expression and performance with PT does not confirm this rating   Pertinent History Rt shoulder scope with open biceps tenodesis on 10/17/2014.    Patient Stated Goals rehab shoulder   Currently in Pain? Yes   Pain Score 8    Pain Location Shoulder   Pain Orientation Right   Pain Descriptors / Indicators Aching;Sore;Tightness   Pain Type Surgical pain   Pain Onset More than a month ago   Pain Frequency Intermittent   Multiple Pain Sites No                          [facility] Adult PT Treatment/Exercise - 02/02/15 0001    Bed Mobility   Bed Mobility --  pt wishes to be called    Posture/Postural Control   Posture/Postural Control No significant limitations   Exercises   Exercises Shoulder   Shoulder Exercises: Seated   Other Seated Exercises chestpress 23# 1 x10  with horizontal and verticalgrip   Shoulder Exercises: Prone   Extension Strengthening;20 reps;Weights  2.5# added   Other Prone Exercises rows with 2.5#   Shoulder Exercises: Sidelying   Flexion Strengthening;20 reps;Weights   ABduction Strengthening;20 reps;Weights  1.5# added   Shoulder Exercises: Standing   Flexion AAROM  walladder to peg 19   ABduction AAROM;10 reps  to peg #14   Other Standing Exercises Pendulum exercises circles each side, side to sside, flexion/ext x 1 min each   Other Standing Exercises Overdoor reach on 8" +4"step (12") x10, able to lowered to 8" & 2" box  pt able to reach corner of doorframe    Shoulder Exercises: Pulleys   Flexion 3 minutes   ABduction 3 minutes   Shoulder Exercises: ROM/Strengthening   Rebounder UBE L8 x (4/4)   Modalities   Modalities Moist Heat   Moist Heat Therapy   Number Minutes Moist Heat 15 Minutes   Moist Heat Location  Shoulder   Cryotherapy   Number Minutes Cryotherapy 19 Minutes   Cryotherapy Location Shoulder   Type of Cryotherapy Ice pack   Manual Therapy   Manual Therapy Soft tissue mobilization  to biceps in supine arms in Abd and ER    Manual therapy comments in left sidelying  to subscapularis, lattisimus & teres minor/major with PROM ito pt tolerance into abduction   Passive ROM PROM to tolerance                  PT Short Term Goals - 01/23/15 1111    PT SHORT TERM GOAL #1   Title be independent in initial HEP   Time 4   Period Weeks   Status Achieved   PT SHORT TERM GOAL #2   Title demonstrate Rt shoulder PROM flexion to > or = to 120 degrees to improve mobility   Time 4   Period Weeks    Status Achieved   PT SHORT TERM GOAL #3   Title report > or = to 50% use of Rt UE for light home tasks   Time 4   Period Weeks   Status Achieved           PT Long Term Goals - 01/30/15 1255    PT LONG TERM GOAL #1   Title be independent in advanced HEP   Time 8   Period Weeks   Status On-going   PT LONG TERM GOAL #2   Title reduce FOTO to < or 39% limitation   Time 8   Period Weeks   Status On-going   PT LONG TERM GOAL #3   Title demonstrate > or = to 120 degrees Rt shoulder AROM to improve overhead reaching   Time 8   Period Weeks   Status On-going   PT LONG TERM GOAL #4   Title demonstrate 4-/5 Rt shoulder strength to improve endurance with use.   Time 8   Period Weeks   Status On-going   PT LONG TERM GOAL #5   Title demonstrate full Rt shoulder PROM throughout   Time 8   Period Weeks   Status On-going   PT LONG TERM GOAL #6   Title report a 75% reduction in Rt shoulder pain with use.     Period Weeks   Status On-going               Plan - 02/02/15 1043    Clinical Impression Statement Pt able to tolerate exercises very well and is able to reach overhead doorframe while standing on steps. Pt will continue to improve with ROM, strength and endurance.   Pt will benefit from skilled therapeutic intervention in order to improve on the following deficits Decreased strength;Decreased scar mobility;Impaired flexibility;Pain;Decreased activity tolerance;Decreased range of motion;Increased fascial restricitons   Rehab Potential Good   PT Frequency 3x / week   PT Duration 8 weeks   PT Treatment/Interventions ADLs/Self Care Home Management;Cryotherapy;Electrical Stimulation;Moist Heat;Therapeutic exercise;Therapeutic activities;Ultrasound;Neuromuscular re-education;Patient/family education;Manual techniques;Vasopneumatic Device;Taping;Passive range of motion   PT Next Visit Plan Conntinue with UBE and strengthening. Continue to release subscapularis, teres major  and lattissimus, and biceps, continue with heat for release of muscle tightness subscapularis and ice on top of schoulder for pain   Consulted and Agree with Plan of Care Patient        Problem List Patient Active Problem List   Diagnosis Date Noted  . Routine general medical examination at a health care facility 03/11/2011  . Elevated  blood-pressure reading without diagnosis of hypertension 03/11/2011    NAUMANN-HOUEGNIFIO,Timara Loma PTA 02/02/2015, 11:14 AM  [facility] [address], [address] [place], [place], [address] Phone: [phone]   Fax:  [phone]  Name: Jose Howell MRN: [id number] Date of Birth: 03-Feb-1951

## 2015-02-06 ENCOUNTER — Ambulatory Visit: Payer: Managed Care, Other (non HMO)

## 2015-02-08 ENCOUNTER — Ambulatory Visit: Payer: No Typology Code available for payment source

## 2015-02-08 DIAGNOSIS — R29898 Other symptoms and signs involving the musculoskeletal system: Secondary | ICD-10-CM

## 2015-02-08 DIAGNOSIS — M25511 Pain in right shoulder: Secondary | ICD-10-CM

## 2015-02-08 DIAGNOSIS — M25611 Stiffness of right shoulder, not elsewhere classified: Secondary | ICD-10-CM

## 2015-02-08 NOTE — Therapy (Signed)
[facility] [address] [place], [place], [address] Phone: [phone]   Fax:  [phone]  Physical Therapy Treatment  Patient Details  Name: Jose Howell MRN: [id number] Date of Birth: 05/06/1950 Referring Provider: Annell Greening, MD  Encounter Date: 02/08/2015      PT End of Session - 02/08/15 1219    Visit Number 16   Date for PT Re-Evaluation 04/06/15   Authorization Type [employer]   Authorization Time Period 11/21/14-08/18/15   Authorization - Visit Number 16   Authorization - Number of Visits 36   PT Start Time 1145   PT Stop Time 1245   PT Time Calculation (min) 60 min   Activity Tolerance Patient tolerated treatment well   Behavior During Therapy [facility] for tasks assessed/performed      Past Medical History  Diagnosis Date  . GERD (gastroesophageal reflux disease)   . Arthritis     Past Surgical History  Procedure Laterality Date  . Umbilical hernia repair    . Radiology with anesthesia Right 10/17/2014    Procedure: MRI RIGHT SHOULDER WITHOUT;  Surgeon: Medication Radiologist, MD;  Location: [facility] OR;  Service: Radiology;  Laterality: Right;  DR. DYER/MRI    There were no vitals filed for this visit.  Visit Diagnosis:  Shoulder stiffness, right - Plan: PT plan of care cert/re-cert  Shoulder weakness - Plan: PT plan of care cert/re-cert  Pain in joint of right shoulder - Plan: PT plan of care cert/re-cert      Subjective Assessment - 02/08/15 1146    Subjective Pt was sick this week.  Renewal needed.  Pt doesn't know when he sees MD again.   Pertinent History Rt shoulder scope with open biceps tenodesis on 10/17/2014.    Currently in Pain? Yes   Pain Score 6    Pain Location Shoulder   Pain Orientation Right   Pain Descriptors / Indicators Aching;Sore;Tightness   Pain Type Surgical pain   Pain Onset More than a month ago   Pain Frequency Intermittent   Aggravating Factors  movement of Rt  arm, use overhead, stretching at end range   Pain Relieving Factors not using arm, rest, medication at times            [facility] PT Assessment - 02/08/15 0001    Assessment   Medical Diagnosis s/p Rt shoulder scope with open biceps tenodesis   Hand Dominance Right   Precautions   Precautions Other (comment)  resitance is OK per MD   Prior Function   Level of Independence Independent   Cognition   Overall Cognitive Status Within Functional Limits for tasks assessed   Observation/Other Assessments   Focus on Therapeutic Outcomes (FOTO)  56% limitation   Posture/Postural Control   Posture/Postural Control No significant limitations   ROM / Strength   AROM / PROM / Strength AROM;PROM;Strength   AROM   Overall AROM  Deficits   AROM Assessment Site Shoulder   Right/Left Shoulder Right   Right Shoulder Flexion 99 Degrees   Right Shoulder ABduction 73 Degrees   Right Shoulder Internal Rotation --  to L3   Right Shoulder External Rotation --  to occiput   PROM   Overall PROM  Deficits   PROM Assessment Site Shoulder   Right/Left Shoulder Right   Right Shoulder Flexion 120 Degrees   Right Shoulder ABduction 80 Degrees   Right Shoulder Internal Rotation 63 Degrees   Right Shoulder External Rotation 59 Degrees  Strength   Overall Strength Deficits   Strength Assessment Site Shoulder   Right/Left Shoulder Right   Right Shoulder Flexion 3+/5   Right Shoulder Extension 4+/5   Right Shoulder ABduction 3-/5   Right Shoulder Internal Rotation 4+/5   Right Shoulder External Rotation 4-/5                     [facility] Adult PT Treatment/Exercise - 02/08/15 0001    Shoulder Exercises: Prone   Extension Strengthening;20 reps;Weights  2.5# added   Other Prone Exercises rows with 2.5#   Shoulder Exercises: Sidelying   Flexion Strengthening;20 reps;Weights   ABduction Strengthening;20 reps;Weights  1.5# added   Shoulder Exercises: Standing   Flexion AAROM  walladder to  peg 19   ABduction AAROM;10 reps  to peg #14   Shoulder Exercises: Pulleys   Flexion 3 minutes   ABduction 3 minutes   Shoulder Exercises: ROM/Strengthening   Rebounder UBE L8 x (4/4)   Modalities   Modalities Moist Heat   Moist Heat Therapy   Number Minutes Moist Heat 15 Minutes   Moist Heat Location Shoulder   Cryotherapy   Number Minutes Cryotherapy 15 Minutes   Cryotherapy Location Shoulder   Type of Cryotherapy Ice pack                  PT Short Term Goals - 02/08/15 1159    PT SHORT TERM GOAL #1   Title be independent in initial HEP   Status Achieved   PT SHORT TERM GOAL #2   Title demonstrate Rt shoulder PROM flexion to > or = to 120 degrees to improve mobility   Status Achieved   PT SHORT TERM GOAL #3   Title report > or = to 50% use of Rt UE for light home tasks   Status Achieved           PT Long Term Goals - 02/08/15 1200    PT LONG TERM GOAL #1   Title be independent in advanced HEP   Time 8   Period Weeks   Status On-going   PT LONG TERM GOAL #2   Title reduce FOTO to < or 39% limitation   Time 8   Period Weeks   Status On-going  56% limitation   PT LONG TERM GOAL #3   Title demonstrate > or = to 120 degrees Rt shoulder AROM to improve overhead reaching   Time 8   Period Weeks   Status On-going  99   PT LONG TERM GOAL #4   Title demonstrate 4-/5 Rt shoulder strength to improve endurance with use.   Time 8   Period Weeks   Status On-going   PT LONG TERM GOAL #5   Title demonstrate full Rt shoulder PROM throughout   Time 8   Period Weeks   Status On-going   PT LONG TERM GOAL #6   Title report a 75% reduction in Rt shoulder pain with use.     Time 8   Period Weeks   Status On-going  60% reduction               Plan - 02/08/15 1204    Clinical Impression Statement Pt reports 50% use of Rt UE with ALDs and self-care.  Pt with painful and limited AROM of Rt shoulder.  AROM flexion limited to 99 degrees, IR to L3,  ER to occiput.  Strength is 3-/5 to 4+/5 in Rt shoulder.  Pt  is progressing well with protocol.  Pain limits use of Rt UE especially overehad.  FOTO score is 56% limitation.  Pt will continue to benefit from skilled PT for Rt shoulder AROM/PROM, strength and endurance progression per protocol.     Pt will benefit from skilled therapeutic intervention in order to improve on the following deficits Decreased strength;Decreased scar mobility;Impaired flexibility;Pain;Decreased activity tolerance;Decreased range of motion;Increased fascial restricitons   Rehab Potential Good   PT Frequency 2x / week   PT Duration 8 weeks   PT Treatment/Interventions ADLs/Self Care Home Management;Cryotherapy;Electrical Stimulation;Moist Heat;Therapeutic exercise;Therapeutic activities;Ultrasound;Neuromuscular re-education;Patient/family education;Manual techniques;Vasopneumatic Device;Taping;Passive range of motion   PT Next Visit Plan Conntinue with UBE and strengthening. Continue AROM progression and strength/endurance progression per protocol.   Consulted and Agree with Plan of Care Patient        Problem List Patient Active Problem List   Diagnosis Date Noted  . Routine general medical examination at a health care facility 03/11/2011  . Elevated blood-pressure reading without diagnosis of hypertension 03/11/2011    TAKACS,KELLY, PT 02/08/2015, 12:21 PM  [facility] [address], [address] [place], [place], [address] Phone: [phone]   Fax:  [phone]  Name: Lelon MastLuis C Korf MRN: [id number] Date of Birth: [birth year]

## 2015-02-13 ENCOUNTER — Ambulatory Visit: Payer: No Typology Code available for payment source | Admitting: Physical Therapy

## 2015-02-15 ENCOUNTER — Encounter: Payer: Managed Care, Other (non HMO) | Admitting: Physical Therapy

## 2015-02-20 ENCOUNTER — Encounter: Payer: Self-pay | Admitting: Physical Therapy

## 2015-02-20 ENCOUNTER — Ambulatory Visit: Payer: No Typology Code available for payment source | Attending: Orthopaedic Surgery | Admitting: Physical Therapy

## 2015-02-20 DIAGNOSIS — R29898 Other symptoms and signs involving the musculoskeletal system: Secondary | ICD-10-CM | POA: Diagnosis present

## 2015-02-20 DIAGNOSIS — M25611 Stiffness of right shoulder, not elsewhere classified: Secondary | ICD-10-CM | POA: Diagnosis present

## 2015-02-20 DIAGNOSIS — M25511 Pain in right shoulder: Secondary | ICD-10-CM | POA: Diagnosis present

## 2015-02-20 NOTE — Therapy (Signed)
[facility] [address], [address] [place], [place], [address] Phone: [phone]   Fax:  [phone]  Physical Therapy Treatment  Patient Details  Name: Jose MastLuis C Howell MRN: [id number] Date of Birth: Apr 03, 1950 Referring Provider: Annell GreeningYates, Mark, MD  Encounter Date: 02/20/2015      PT End of Session - 02/20/15 1132    Visit Number 17   Number of Visits 19   Date for PT Re-Evaluation 04/06/15   Authorization Type [employer]   Authorization Time Period 11/21/14-08/18/15   Authorization - Visit Number 16   Authorization - Number of Visits 36   PT Start Time 1053   PT Stop Time 1150   PT Time Calculation (min) 57 min   Activity Tolerance Patient tolerated treatment well   Behavior During Therapy [facility] for tasks assessed/performed      Past Medical History  Diagnosis Date  . GERD (gastroesophageal reflux disease)   . Arthritis     Past Surgical History  Procedure Laterality Date  . Umbilical hernia repair    . Radiology with anesthesia Right 10/17/2014    Procedure: MRI RIGHT SHOULDER WITHOUT;  Surgeon: Medication Radiologist, MD;  Location: [facility] OR;  Service: Radiology;  Laterality: Right;  DR. DYER/MRI    There were no vitals filed for this visit.  Visit Diagnosis:  Shoulder stiffness, right  Shoulder weakness  Pain in joint of right shoulder      Subjective Assessment - 02/20/15 1111    Subjective Pt rates his pain 4/10 and with movement 6/10. Pt has MD appointment today   Pertinent History Rt shoulder scope with open biceps tenodesis on 10/17/2014.    Patient Stated Goals rehab shoulder   Currently in Pain? Yes   Pain Score 4    Pain Location Shoulder   Pain Orientation Right   Pain Descriptors / Indicators Aching;Sore;Tightness   Pain Type Surgical pain   Pain Onset More than a month ago   Pain Frequency Intermittent   Aggravating Factors  movement with Right arm, overhead use, stretching at end range   Pain  Relieving Factors resting arm, not using it, medication at times   Multiple Pain Sites No                         [facility] Adult PT Treatment/Exercise - 02/20/15 0001    Bed Mobility   Bed Mobility --  pt wishes to be called Jose Howell   Posture/Postural Control   Posture/Postural Control No significant limitations   Exercises   Exercises Shoulder   Shoulder Exercises: Seated   Other Seated Exercises chestpress 23# 1 x10  horizontal and vertical grip   Shoulder Exercises: Prone   Extension Strengthening;20 reps;Weights  2.5# added   Other Prone Exercises rows with 2.5#   Shoulder Exercises: Sidelying   Flexion Strengthening;20 reps;Weights  1.5# added   ABduction Strengthening;20 reps;Weights  1.5# added   Shoulder Exercises: Standing   Flexion AAROM  walladder to peg #19, 1# added   ABduction AAROM;10 reps  walladder 1# added   Other Standing Exercises Pendulum exercises circles each side, side to sside, flexion/ext x 1 min each   Shoulder Exercises: Pulleys   Flexion 3 minutes   ABduction 3 minutes   Shoulder Exercises: ROM/Strengthening   Rebounder UBE L8 x 8min (4/4)   Modalities   Modalities Moist Heat   Moist Heat Therapy   Number Minutes Moist Heat 15 Minutes   Moist Heat  Location Shoulder  under axilla    Cryotherapy   Number Minutes Cryotherapy 15 Minutes   Cryotherapy Location Shoulder   Type of Cryotherapy Ice pack  on top of shoulder                  PT Short Term Goals - 02/08/15 1159    PT SHORT TERM GOAL #1   Title be independent in initial HEP   Status Achieved   PT SHORT TERM GOAL #2   Title demonstrate Rt shoulder PROM flexion to > or = to 120 degrees to improve mobility   Status Achieved   PT SHORT TERM GOAL #3   Title report > or = to 50% use of Rt UE for light home tasks   Status Achieved           PT Long Term Goals - 02/20/15 1145    PT LONG TERM GOAL #1   Title be independent in advanced HEP   Time 8    Period Weeks   Status On-going   PT LONG TERM GOAL #2   Title reduce FOTO to < or 39% limitation   Time 8   Period Weeks   Status On-going   PT LONG TERM GOAL #3   Title demonstrate > or = to 120 degrees Rt shoulder AROM to improve overhead reaching  100 degrees of flexion Feb 20, 2015   Time 8   Period Weeks   Status On-going   PT LONG TERM GOAL #4   Title demonstrate 4-/5 Rt shoulder strength to improve endurance with use.   Status Achieved   PT LONG TERM GOAL #5   Title demonstrate full Rt shoulder PROM throughout   Time 8   Period Weeks   Status On-going   PT LONG TERM GOAL #6   Title report a 75% reduction in Rt shoulder pain with use.     Time 8   Period Weeks   Status On-going               Plan - 02/20/15 1133    Clinical Impression Statement Pt reports 50% use of Rt UE with ADL's and self-care. Pt with painful and limited AROM of Rt shoulder. AROM flexion limited to100degrees, Abductiio 85, IR to L3, ER to occiput. Strength is 3-/5 to 4+/5 in Rt shoulder. Foto score is 565 limitations. Pt will continue to benefit from skilled PT for Rt shoulder AROM/PROM, strength and endurance.   Pt will benefit from skilled therapeutic intervention in order to improve on the following deficits Decreased strength;Decreased scar mobility;Impaired flexibility;Pain;Decreased activity tolerance;Decreased range of motion;Increased fascial restricitons   Rehab Potential Good   PT Frequency 2x / week   PT Duration 8 weeks   PT Next Visit Plan Conntinue with UBE and strengthening. Continue AROM progression and strength/endurance progression per protocol.   Consulted and Agree with Plan of Care Patient        Problem List Patient Active Problem List   Diagnosis Date Noted  . Routine general medical examination at a health care facility 03/11/2011  . Elevated blood-pressure reading without diagnosis of hypertension 03/11/2011    NAUMANN-HOUEGNIFIO,Devann Cribb, PTA 02/20/2015, 12:44  PM  [facility] [address] [place], [place], [address] Phone: [phone]   Fax:  [phone]  Name: Jose Howell MRN: [id number] Date of Birth: 1950/09/14

## 2015-02-22 ENCOUNTER — Ambulatory Visit: Payer: No Typology Code available for payment source | Admitting: Physical Therapy

## 2015-02-27 ENCOUNTER — Ambulatory Visit: Payer: Self-pay

## 2015-03-01 ENCOUNTER — Encounter: Payer: Managed Care, Other (non HMO) | Admitting: Physical Therapy

## 2015-03-01 ENCOUNTER — Ambulatory Visit: Payer: No Typology Code available for payment source | Admitting: Physical Therapy

## 2015-03-01 DIAGNOSIS — M25611 Stiffness of right shoulder, not elsewhere classified: Secondary | ICD-10-CM

## 2015-03-01 DIAGNOSIS — R29898 Other symptoms and signs involving the musculoskeletal system: Secondary | ICD-10-CM

## 2015-03-01 DIAGNOSIS — M25511 Pain in right shoulder: Secondary | ICD-10-CM

## 2015-03-01 NOTE — Therapy (Signed)
[facility] [address] [place], [place], [address] Phone: [phone]   Fax:  [phone]  Physical Therapy Treatment  Patient Details  Name: Jose Howell MRN: [id number] Date of Birth: 1950/05/08 Referring Provider: Annell Greening, MD  Encounter Date: 03/01/2015      PT End of Session - 03/01/15 1154    Visit Number 18   Number of Visits 19   Date for PT Re-Evaluation 04/06/15   Authorization Type [employer]   Authorization Time Period 11/21/14-08/18/15   Authorization - Visit Number 18   Authorization - Number of Visits 36   PT Start Time 1140   PT Stop Time 1228   PT Time Calculation (min) 48 min   Activity Tolerance Patient tolerated treatment well   Behavior During Therapy [facility] for tasks assessed/performed      Past Medical History  Diagnosis Date  . GERD (gastroesophageal reflux disease)   . Arthritis     Past Surgical History  Procedure Laterality Date  . Umbilical hernia repair    . Radiology with anesthesia Right 10/17/2014    Procedure: MRI RIGHT SHOULDER WITHOUT;  Surgeon: Medication Radiologist, MD;  Location: [facility] OR;  Service: Radiology;  Laterality: Right;  DR. DYER/MRI    There were no vitals filed for this visit.  Visit Diagnosis:  Shoulder stiffness, right  Shoulder weakness  Pain in joint of right shoulder      Subjective Assessment - 03/01/15 1151    Subjective Pt rates his pain as a 6/10 in his Rt shoulder.    Pertinent History Rt shoulder scope with open biceps tenodesis on 10/17/2014.    Patient Stated Goals rehab shoulder   Currently in Pain? Yes   Pain Score 6    Pain Location Shoulder   Pain Orientation Right   Pain Onset More than a month ago   Pain Frequency Intermittent   Aggravating Factors  movement with Rt arm, overhead use, stretching at end range   Pain Relieving Factors resting arm, not using it, medication at times   Multiple Pain Sites No                          [facility] Adult PT Treatment/Exercise - 03/01/15 0001    Bed Mobility   Bed Mobility --  pt wishes to be called Jose Howell   Posture/Postural Control   Posture/Postural Control No significant limitations   Exercises   Exercises Shoulder   Shoulder Exercises: Seated   Other Seated Exercises chestpress 23# 1 x10  horizontal and vertical grip    Shoulder Exercises: Prone   Extension Strengthening;20 reps;Weights  2.5# added   Other Prone Exercises rows with 2.5#   Shoulder Exercises: Sidelying   Flexion Strengthening;20 reps;Weights  1.5# added   ABduction Strengthening;20 reps;Weights  1.5# added    Shoulder Exercises: Standing   Other Standing Exercises Pendulum exercises circles each side, side to sside, flexion/ext x 1 min each   Other Standing Exercises Overdoor reach on 8" +4"step 2 x10, initially difficult to reach but improved with repetitions   Shoulder Exercises: Pulleys   Flexion 3 minutes   Shoulder Exercises: ROM/Strengthening   Rebounder UBE L8 x (4/4)   Modalities   Modalities Moist Heat   Moist Heat Therapy   Number Minutes Moist Heat 15 Minutes   Moist Heat Location Shoulder  axilla for lattissimus and one for UT  PT Short Term Goals - 02/08/15 1159    PT SHORT TERM GOAL #1   Title be independent in initial HEP   Status Achieved   PT SHORT TERM GOAL #2   Title demonstrate Rt shoulder PROM flexion to > or = to 120 degrees to improve mobility   Status Achieved   PT SHORT TERM GOAL #3   Title report > or = to 50% use of Rt UE for light home tasks   Status Achieved           PT Long Term Goals - 03/01/15 1228    PT LONG TERM GOAL #1   Title be independent in advanced HEP   Time 8   Period Weeks   Status On-going   PT LONG TERM GOAL #2   Title reduce FOTO to < or 39% limitation   Time 8   Period Weeks   Status On-going   PT LONG TERM GOAL #3   Title demonstrate > or = to 120  degrees Rt shoulder AROM to improve overhead reaching   Time 8   Period Weeks   PT LONG TERM GOAL #4   Title demonstrate 4-/5 Rt shoulder strength to improve endurance with use.   Time 8   Period Weeks   Status Achieved   PT LONG TERM GOAL #5   Title demonstrate full Rt shoulder PROM throughout   Time 8   Period Weeks   Status On-going   PT LONG TERM GOAL #6   Title report a 75% reduction in Rt shoulder pain with use.     Time 8   Period Weeks   Status On-going               Plan - 03/01/15 1155    Clinical Impression Statement Pt limited with ROM and lweakness right arm. Pt will continue to benefit from skilled PT for Rt shoulder AROM,PROM strength and endurance   Pt will benefit from skilled therapeutic intervention in order to improve on the following deficits Decreased strength;Decreased scar mobility;Impaired flexibility;Pain;Decreased activity tolerance;Decreased range of motion;Increased fascial restricitons   Rehab Potential Good   PT Frequency 2x / week   PT Duration 8 weeks   PT Treatment/Interventions ADLs/Self Care Home Management;Cryotherapy;Electrical Stimulation;Moist Heat;Therapeutic exercise;Therapeutic activities;Ultrasound;Neuromuscular re-education;Patient/family education;Manual techniques;Vasopneumatic Device;Taping;Passive range of motion   PT Next Visit Plan Pt needs G-Code next visit. Pt is may be traveling to [place] he will call to inform us about the date to be able to cancel appointments during this time. Continue with UBE and strengthening. Continue AROM progression and strength/endurance progression per protocol.   Consulted and Agree with Plan of Care Patient        Problem List Patient Active Problem List   Diagnosis Date Noted  . Routine general medical examination at a health care facility 03/11/2011  . Elevated blood-pressure reading without diagnosis of hypertension 03/11/2011    NAUMANN-HOUEGNIFIO,Wm Fruchter PTA 03/01/2015, 12:33  PM  [facility] [address] [place], [place], [address] Phone: [phone]   Fax:  [phone]  Name: Jose Howell MRN: [id number] Date of Birth: 03/14/1950

## 2015-03-06 ENCOUNTER — Ambulatory Visit: Payer: Managed Care, Other (non HMO)

## 2015-03-13 ENCOUNTER — Encounter: Payer: Managed Care, Other (non HMO) | Admitting: Physical Therapy

## 2015-03-15 ENCOUNTER — Encounter: Payer: Managed Care, Other (non HMO) | Admitting: Physical Therapy

## 2015-03-20 ENCOUNTER — Ambulatory Visit: Payer: No Typology Code available for payment source | Attending: Orthopaedic Surgery | Admitting: Physical Therapy

## 2015-03-20 ENCOUNTER — Encounter: Payer: Self-pay | Admitting: Physical Therapy

## 2015-03-20 DIAGNOSIS — R29898 Other symptoms and signs involving the musculoskeletal system: Secondary | ICD-10-CM | POA: Insufficient documentation

## 2015-03-20 DIAGNOSIS — M25611 Stiffness of right shoulder, not elsewhere classified: Secondary | ICD-10-CM | POA: Insufficient documentation

## 2015-03-20 DIAGNOSIS — M25511 Pain in right shoulder: Secondary | ICD-10-CM | POA: Insufficient documentation

## 2015-03-20 NOTE — Therapy (Addendum)
[facility] [address], [address] [place], [place], [address] Phone: [phone]   Fax:  [phone]  Physical Therapy Treatment  Patient Details  Name: Jose Howell MRN: [id number] Date of Birth: 02-Aug-1950 Referring Provider: Annell Greening, MD  Encounter Date: 03/20/2015      PT End of Session - 03/20/15 1116    Visit Number 19   Number of Visits 29   Date for PT Re-Evaluation 04/06/15   Authorization Type [employer]   Authorization Time Period 11/21/14-08/18/15   Authorization - Visit Number 19   Authorization - Number of Visits 36   PT Start Time 1050   PT Stop Time 1135   PT Time Calculation (min) 45 min   Activity Tolerance Patient tolerated treatment well   Behavior During Therapy [facility] for tasks assessed/performed      Past Medical History  Diagnosis Date  . GERD (gastroesophageal reflux disease)   . Arthritis     Past Surgical History  Procedure Laterality Date  . Umbilical hernia repair    . Radiology with anesthesia Right 10/17/2014    Procedure: MRI RIGHT SHOULDER WITHOUT;  Surgeon: Medication Radiologist, MD;  Location: [facility] OR;  Service: Radiology;  Laterality: Right;  DR. DYER/MRI    There were no vitals filed for this visit.  Visit Diagnosis:  Shoulder stiffness, right  Shoulder weakness  Pain in joint of right shoulder      Subjective Assessment - 03/20/15 1110    Subjective Pain in Right shoulder is rated as 2/10, all over feeling better with movement in Rt shoulder   Pertinent History Rt shoulder scope with open biceps tenodesis on 10/17/2014.    Patient Stated Goals rehab shoulder   Currently in Pain? Yes   Pain Score 2    Pain Location Shoulder   Pain Orientation Right   Pain Type Surgical pain   Pain Onset More than a month ago   Pain Frequency Intermittent   Aggravating Factors  Movement with Rt arm, overhead activities, stretching at end range   Pain Relieving Factors resting arm, not  using it, medication at times   Multiple Pain Sites No            [facility] PT Assessment - 03/20/15 0001    Assessment   Medical Diagnosis s/p Rt shoulder scope with open biceps tenodesis   Hand Dominance Right   Precautions   Precautions Other (comment)   Prior Function   Level of Independence Independent   Cognition   Overall Cognitive Status Within Functional Limits for tasks assessed   Observation/Other Assessments   Focus on Therapeutic Outcomes (FOTO)  48%   ROM / Strength   AROM / PROM / Strength AROM   AROM   Overall AROM  Deficits   AROM Assessment Site Shoulder   Right/Left Shoulder Right   Right Shoulder Flexion 110 Degrees   Right Shoulder ABduction 80 Degrees   Right Shoulder Internal Rotation --  to L3   Right Shoulder External Rotation --  to occiput   Strength   Overall Strength Deficits   Strength Assessment Site Shoulder   Right/Left Shoulder Right   Right Shoulder Flexion 4-/5  in available range   Right Shoulder Extension 4-/5  in available range   Right Shoulder ABduction 3/5  in available range   Right Shoulder Internal Rotation 4+/5   Right Shoulder External Rotation 4+/5  [facility] Adult PT Treatment/Exercise - 03/20/15 0001    Bed Mobility   Bed Mobility --  pt wishes to be called Jose Howell   Posture/Postural Control   Posture/Postural Control No significant limitations   Exercises   Exercises Shoulder   Shoulder Exercises: Seated   Other Seated Exercises chestpress 23# 2 x10  horizontal & vertical grip, 2nd set challenging   Shoulder Exercises: Prone   Extension Strengthening;20 reps;Weights  1.5# added, may incr next visit to 2#   Other Prone Exercises rows with 1.5#  increase to 2#   Shoulder Exercises: Sidelying   ABduction Strengthening;20 reps;Weights  1.5# added, with shorc & long arc 2 x10   Shoulder Exercises: Standing   Other Standing Exercises Pendulum exercises circles each side, side to  sside, flexion/ext x 1 min each   Shoulder Exercises: Pulleys   Flexion 3 minutes   Shoulder Exercises: ROM/Strengthening   Rebounder UBE L8 x (4/4)   Modalities   Modalities Moist Heat   Moist Heat Therapy   Number Minutes Moist Heat 15 Minutes   Moist Heat Location Shoulder   Manual Therapy   Manual Therapy Soft tissue mobilization   Manual therapy comments in left sidelying  to subscapularis, lattisimus & teres minor/major with PROM ito pt tolerance into abduction   Passive ROM PROM to tolerance                  PT Short Term Goals - 03/20/15 1121    PT SHORT TERM GOAL #1   Title be independent in initial HEP   Time 4   Period Weeks   Status Achieved   PT SHORT TERM GOAL #2   Title demonstrate Rt shoulder PROM flexion to > or = to 120 degrees to improve mobility   Time 4   Period Weeks   Status Achieved   PT SHORT TERM GOAL #3   Title report > or = to 50% use of Rt UE for light home tasks   Time 4   Period Weeks   Status Achieved           PT Long Term Goals - 03/20/15 1201    PT LONG TERM GOAL #1   Title be independent in advanced HEP   Time 8   Period Weeks   Status On-going   PT LONG TERM GOAL #2   Title reduce FOTO to < or 39% limitation  as of Feb 7/17  48%   Time 8   Period Weeks   Status On-going   PT LONG TERM GOAL #3   Title demonstrate > or = to 120 degrees Rt shoulder AROM to improve overhead reaching  110 as of Feb 03/20/15   Time 8   Period Weeks   Status On-going   PT LONG TERM GOAL #4   Title demonstrate 4-/5 Rt shoulder strength to improve endurance with use.   Time 8   Period Weeks   Status Achieved   PT LONG TERM GOAL #5   Title demonstrate full Rt shoulder PROM throughout   Time 8   Period Weeks   Status On-going   PT LONG TERM GOAL #6   Title report a 75% reduction in Rt shoulder pain with use.     Time 8   Period Weeks   Status On-going               Plan - 03/20/15 1142    Clinical Impression  Statement Pt limited in Right  shoulder with ROM, endurance and weakness. Right shoulderin degrees flexion:       abduction                Strength: flexion               abduction       .Pt will continue to improve with PT to address limitations in Rt shoulder.   Pt will benefit from skilled therapeutic intervention in order to improve on the following deficits Decreased strength;Decreased scar mobility;Impaired flexibility;Pain;Decreased activity tolerance;Decreased range of motion;Increased fascial restricitons   Rehab Potential Good   PT Frequency 2x / week   PT Duration 8 weeks   PT Treatment/Interventions ADLs/Self Care Home Management;Cryotherapy;Electrical Stimulation;Moist Heat;Therapeutic exercise;Therapeutic activities;Ultrasound;Neuromuscular re-education;Patient/family education;Manual techniques;Vasopneumatic Device;Taping;Passive range of motion   PT Next Visit Plan Continue to improve ROM, strength and endurance.   Consulted and Agree with Plan of Care Patient          G-Codes - April 05, 2015 1206-07-05    Functional Assessment Tool Used FOTO: 48% limitation   Functional Limitation Other PT primary   Other PT Primary Goal Status ([id number]) At least 20 percent but less than 40 percent impaired, limited or restricted   Other PT Primary Discharge Status [phone]) At least 40 percent but less than 60 percent impaired, limited or restricted      Problem List Patient Active Problem List   Diagnosis Date Noted  . Routine general medical examination at a health care facility 03/11/2011  . Elevated blood-pressure reading without diagnosis of hypertension 03/11/2011   Lorrene Reid, PT 04-05-15 12:10 PM  TAKACS,KELLY PTA 04/05/15, 12:10 PM Ethyl Vila Naumann-Houegnifio, PTA 04-05-2015 2:22 PM   [facility] [address] [place], [place], [address] Phone: [phone]   Fax:  [phone]  Name: Jose Howell MRN: [id number] Date  of Birth: 1950-07-04

## 2015-03-22 ENCOUNTER — Ambulatory Visit: Payer: No Typology Code available for payment source | Admitting: Physical Therapy

## 2015-03-27 ENCOUNTER — Ambulatory Visit: Payer: No Typology Code available for payment source

## 2015-03-27 DIAGNOSIS — M25611 Stiffness of right shoulder, not elsewhere classified: Secondary | ICD-10-CM | POA: Diagnosis not present

## 2015-03-27 DIAGNOSIS — R29898 Other symptoms and signs involving the musculoskeletal system: Secondary | ICD-10-CM

## 2015-03-27 DIAGNOSIS — M25511 Pain in right shoulder: Secondary | ICD-10-CM

## 2015-03-27 NOTE — Therapy (Addendum)
[facility] [address], [address] [place], [place], [address] Phone: [phone]   Fax:  [phone]  Physical Therapy Treatment  Patient Details  Name: Jose Howell MRN: [id number] Date of Birth: 07-03-1950 Referring Provider: Annell Greening, MD  Encounter Date: 03/27/2015      PT End of Session - 03/27/15 1137    Visit Number 20   Number of Visits 29   Authorization Type [employer]   Authorization Time Period 11/21/14-08/18/15   Authorization - Visit Number 20   Authorization - Number of Visits 36   PT Start Time 1054   PT Stop Time 1148   PT Time Calculation (min) 54 min   Activity Tolerance Patient tolerated treatment well   Behavior During Therapy [facility] for tasks assessed/performed      Past Medical History  Diagnosis Date  . GERD (gastroesophageal reflux disease)   . Arthritis     Past Surgical History  Procedure Laterality Date  . Umbilical hernia repair    . Radiology with anesthesia Right 10/17/2014    Procedure: MRI RIGHT SHOULDER WITHOUT;  Surgeon: Medication Radiologist, MD;  Location: [facility] OR;  Service: Radiology;  Laterality: Right;  DR. DYER/MRI    There were no vitals filed for this visit.  Visit Diagnosis:  Shoulder stiffness, right  Shoulder weakness  Pain in joint of right shoulder      Subjective Assessment - 03/27/15 1056    Subjective Pt reports that he is feeling better mentally.     Currently in Pain? Yes   Pain Score 2    Pain Orientation Right   Pain Descriptors / Indicators Sore;Tightness;Aching   Pain Type Acute pain   Pain Onset More than a month ago   Pain Frequency Intermittent   Aggravating Factors  reaching with Rt UE, overhead activity, stretching at end range   Pain Relieving Factors rest, sometimes medication, not reaching with Rt UE                         [facility] Adult PT Treatment/Exercise - 03/27/15 0001    Shoulder Exercises: Seated   Other Seated  Exercises chest press 25# 2 x10  horizontal & vertical grip, 2nd set challenging   Shoulder Exercises: Prone   Extension Strengthening;20 reps;Weights  1x10 with 2# then increased to 3#   Extension Weight (lbs) 3   Horizontal ABduction 1 Strengthening;Right;20 reps;Weights   Horizontal ABduction 1 Weight (lbs) 3  verbal and tactile cues to reduce substitution   Other Prone Exercises rowing with 3# 3x10   Shoulder Exercises: Sidelying   ABduction Strengthening;20 reps;Weights  long arc   ABduction Weight (lbs) 2   Shoulder Exercises: Standing   Flexion AAROM;20 reps;Right   1# added   Shoulder Flexion Weight (lbs) 1   ABduction AAROM;20 reps;Right  walladder 1# added   Shoulder ABduction Weight (lbs) 1   Other Standing Exercises Pendulum exercises circles each side, side to sside, flexion/ext x 1 min each   Shoulder Exercises: Pulleys   Flexion 3 minutes   Shoulder Exercises: ROM/Strengthening   Rebounder UBE L8 x (4/4)   Modalities   Modalities Moist Heat   Moist Heat Therapy   Number Minutes Moist Heat 15 Minutes   Moist Heat Location Shoulder                  PT Short Term Goals - 03/20/15 1121    PT SHORT TERM GOAL #  1   Title be independent in initial HEP   Time 4   Period Weeks   Status Achieved   PT SHORT TERM GOAL #2   Title demonstrate Rt shoulder PROM flexion to > or = to 120 degrees to improve mobility   Time 4   Period Weeks   Status Achieved   PT SHORT TERM GOAL #3   Title report > or = to 50% use of Rt UE for light home tasks   Time 4   Period Weeks   Status Achieved           PT Long Term Goals - 03/27/15 1059    PT LONG TERM GOAL #1   Title be independent in advanced HEP   Time 8   Period Weeks   Status On-going   PT LONG TERM GOAL #2   Title reduce FOTO to < or 39% limitation   Time 8   Period Weeks   Status On-going  48%   PT LONG TERM GOAL #3   Title demonstrate > or = to 120 degrees Rt shoulder AROM to improve  overhead reaching   Time 8   Period Weeks   Status On-going  110   PT LONG TERM GOAL #5   Title demonstrate full Rt shoulder PROM throughout   Time 8   Period Weeks   Status On-going               Plan - 03/27/15 1102    Clinical Impression Statement Pt with continued Rt shoulder limited ROM, strength and endurance.  Pt is making steady improvements wth strength and AROM.  FOTO score was 48% last sesion.  Pt limited with reaching overhead and behind the back.  Pt will continue to benefit from skilled PT for Rt shoulder strength, AROM and endurance progression.    Pt will benefit from skilled therapeutic intervention in order to improve on the following deficits Decreased strength;Decreased scar mobility;Impaired flexibility;Pain;Decreased activity tolerance;Decreased range of motion;Increased fascial restricitons   Rehab Potential Good   PT Frequency 2x / week   PT Duration 8 weeks   PT Treatment/Interventions ADLs/Self Care Home Management;Cryotherapy;Electrical Stimulation;Moist Heat;Therapeutic exercise;Therapeutic activities;Ultrasound;Neuromuscular re-education;Patient/family education;Manual techniques;Vasopneumatic Device;Taping;Passive range of motion   PT Next Visit Plan Continue to improve ROM, strength and endurance.  Renewal next week.    Consulted and Agree with Plan of Care Patient        Problem List Patient Active Problem List   Diagnosis Date Noted  . Routine general medical examination at a health care facility 03/11/2011  . Elevated blood-pressure reading without diagnosis of hypertension 03/11/2011    Wilbern Pennypacker, PT 03/27/2015, 11:57 AM  [facility] [address] [place], [place], [address] Phone: [phone]   Fax:  [phone]  Name: Jose Howell MRN: [id number] Date of Birth: [birth year]

## 2015-03-29 ENCOUNTER — Encounter: Payer: Self-pay | Admitting: Physical Therapy

## 2015-03-29 ENCOUNTER — Ambulatory Visit: Payer: No Typology Code available for payment source | Admitting: Physical Therapy

## 2015-03-29 DIAGNOSIS — M25611 Stiffness of right shoulder, not elsewhere classified: Secondary | ICD-10-CM | POA: Diagnosis not present

## 2015-03-29 DIAGNOSIS — R29898 Other symptoms and signs involving the musculoskeletal system: Secondary | ICD-10-CM

## 2015-03-29 DIAGNOSIS — M25511 Pain in right shoulder: Secondary | ICD-10-CM

## 2015-03-29 NOTE — Therapy (Signed)
[facility] [address], [address] [place], [place], [address] Phone: [phone]   Fax:  [phone]  Physical Therapy Treatment  Patient Details  Name: Jose Howell MRN: [id number] Date of Birth: Jun 15, 1950 Referring Provider: Annell Greening, MD  Encounter Date: 03/29/2015      PT End of Session - 03/29/15 1112    Visit Number 21   Number of Visits 29   Date for PT Re-Evaluation 04/06/15   Authorization Type [employer]   Authorization Time Period 11/21/14-08/18/15   Authorization - Visit Number 21   Authorization - Number of Visits 36   PT Start Time 1058   PT Stop Time 1153   PT Time Calculation (min) 55 min   Activity Tolerance Patient tolerated treatment well   Behavior During Therapy [facility] for tasks assessed/performed      Past Medical History  Diagnosis Date  . GERD (gastroesophageal reflux disease)   . Arthritis     Past Surgical History  Procedure Laterality Date  . Umbilical hernia repair    . Radiology with anesthesia Right 10/17/2014    Procedure: MRI RIGHT SHOULDER WITHOUT;  Surgeon: Medication Radiologist, MD;  Location: [facility];  Service: Radiology;  Laterality: Right;  DR. DYER/MRI    There were no vitals filed for this visit.  Visit Diagnosis:  Shoulder stiffness, right  Shoulder weakness  Pain in joint of right shoulder      Subjective Assessment - 03/29/15 1107    Subjective Pt reports his pain in Rt shoulder is rated as 2/10, mentally feeeling better.   Pertinent History Rt shoulder scope with open biceps tenodesis on 10/17/2014.    Patient Stated Goals rehab shoulder   Currently in Pain? Yes   Pain Score 2    Pain Location Shoulder   Pain Orientation Right   Pain Descriptors / Indicators Aching;Sore;Tightness   Pain Type Acute pain;Other (Comment)   Pain Onset More than a month ago   Pain Frequency Intermittent   Aggravating Factors  reaching with Rt UE, overhead activity, stretching at  end range   Pain Relieving Factors rest, sometimes medication, not reaching with Rt UE   Multiple Pain Sites No                         [facility] Adult PT Treatment/Exercise - 03/29/15 0001    Bed Mobility   Bed Mobility --  pt wishes to be called Jose Howell   Posture/Postural Control   Posture/Postural Control No significant limitations   Exercises   Exercises Shoulder   Shoulder Exercises: Seated   Other Seated Exercises chest press 25# 2 x10, horizontal & vertical grip   Shoulder Exercises: Prone   Extension Strengthening;20 reps;Weights   Extension Weight (lbs) 3   Horizontal ABduction 1 Strengthening;Right;20 reps;Weights   Horizontal ABduction 1 Weight (lbs) 3, verbal and tactile cues to reduce substitution   Other Prone Exercises rowing with 3# 3x10   Shoulder Exercises: Sidelying   Flexion Strengthening;20 reps;Weights   Flexion Weight (lbs) 2   ABduction Strengthening;20 reps;Weights   ABduction Weight (lbs) 2   Shoulder Exercises: Standing   Flexion AAROM;20 reps;Right   Shoulder Flexion Weight (lbs) 1   ABduction AAROM;20 reps;Right   Shoulder ABduction Weight (lbs) 1   Other Standing Exercises Pendulum exercises circles each side, side to sside, flexion/ext x 1 min each   Shoulder Exercises: Pulleys   Flexion 3 minutes   ABduction 1 minute  Shoulder Exercises: ROM/Strengthening   UBE (Upper Arm Bike) UBE L 8 x (4/4)    Modalities   Modalities Moist Heat   Moist Heat Therapy   Number Minutes Moist Heat 15 Minutes   Moist Heat Location Shoulder   Manual Therapy   Manual Therapy Soft tissue mobilization   Manual therapy comments in left sidelying  to subscapularis, lattisimus & teres minor/major with PROM ito pt tolerance into abduction   Passive ROM PROM to tolerance                  PT Short Term Goals - 03/20/15 1121    PT SHORT TERM GOAL #1   Title be independent in initial HEP   Time 4   Period Weeks   Status Achieved   PT  SHORT TERM GOAL #2   Title demonstrate Rt shoulder PROM flexion to > or = to 120 degrees to improve mobility   Time 4   Period Weeks   Status Achieved   PT SHORT TERM GOAL #3   Title report > or = to 50% use of Rt UE for light home tasks   Time 4   Period Weeks   Status Achieved           PT Long Term Goals - 03/27/15 1059    PT LONG TERM GOAL #1   Title be independent in advanced HEP   Time 8   Period Weeks   Status On-going   PT LONG TERM GOAL #2   Title reduce FOTO to < or 39% limitation   Time 8   Period Weeks   Status On-going  48%   PT LONG TERM GOAL #3   Title demonstrate > or = to 120 degrees Rt shoulder AROM to improve overhead reaching   Time 8   Period Weeks   Status On-going  110   PT LONG TERM GOAL #5   Title demonstrate full Rt shoulder PROM throughout   Time 8   Period Weeks   Status On-going               Plan - 03/29/15 1113    Clinical Impression Statement Pt with limited ROM, strength and endurance Rt shoulder, after strengthening exercises incrased pain in Rt shoulder. Pt will continue to benefit from skilled PT for Rt shoulder strength, AROM and endurance.    Pt will benefit from skilled therapeutic intervention in order to improve on the following deficits Decreased strength;Decreased scar mobility;Impaired flexibility;Pain;Decreased activity tolerance;Decreased range of motion;Increased fascial restricitons   Rehab Potential Good   PT Frequency 2x / week   PT Duration 8 weeks   PT Next Visit Plan May add cone stacking. Continue to improve ROM, strength and endurance.  Renewal next week.    Consulted and Agree with Plan of Care Patient        Problem List Patient Active Problem List   Diagnosis Date Noted  . Routine general medical examination at a health care facility 03/11/2011  . Elevated blood-pressure reading without diagnosis of hypertension 03/11/2011    NAUMANN-HOUEGNIFIO,Apryle Stowell PTA 03/29/2015, 11:48 AM  [facility] [address], [address] [place], [place], [address] Phone: [phone]   Fax:  [phone]  Name: Jose Howell MRN: [id number] Date of Birth: 1950/07/22

## 2015-04-03 ENCOUNTER — Ambulatory Visit: Payer: No Typology Code available for payment source | Admitting: Physical Therapy

## 2015-04-03 ENCOUNTER — Encounter: Payer: Self-pay | Admitting: Physical Therapy

## 2015-04-03 DIAGNOSIS — M25611 Stiffness of right shoulder, not elsewhere classified: Secondary | ICD-10-CM

## 2015-04-03 DIAGNOSIS — M25511 Pain in right shoulder: Secondary | ICD-10-CM

## 2015-04-03 DIAGNOSIS — R29898 Other symptoms and signs involving the musculoskeletal system: Secondary | ICD-10-CM

## 2015-04-03 NOTE — Therapy (Addendum)
[facility] [address] [place], [place], [address] Phone: [phone]   Fax:  [phone]  Physical Therapy Treatment  Patient Details  Name: Jose Howell MRN: [id number] Date of Birth: [birth year] Referring Provider: Rodell Perna, MD  Encounter Date: 04/03/2015      PT End of Session - 04/03/15 1110    Visit Number 22   Number of Visits 29   Date for PT Re-Evaluation 04/06/15   Authorization Type [employer]   Authorization Time Period 11/21/14-08/18/15   Authorization - Visit Number 21   Authorization - Number of Visits 36   PT Start Time 3664   PT Stop Time 1158   PT Time Calculation (min) 53 min   Activity Tolerance Patient tolerated treatment well   Behavior During Therapy [facility] for tasks assessed/performed      Past Medical History  Diagnosis Date  . GERD (gastroesophageal reflux disease)   . Arthritis     Past Surgical History  Procedure Laterality Date  . Umbilical hernia repair    . Radiology with anesthesia Right 10/17/2014    Procedure: MRI RIGHT SHOULDER WITHOUT;  Surgeon: Medication Radiologist, MD;  Location: [place];  Service: Radiology;  Laterality: Right;  DR. DYER/MRI    There were no vitals filed for this visit.  Visit Diagnosis:  Shoulder stiffness, right  Shoulder weakness  Pain in joint of right shoulder      Subjective Assessment - 04/03/15 1130    Subjective Pt reports reaching is limited to shoulder level, unable to brush his hair with Rt UE or reaching into cabinets. Pain rated in Rt shoulder rated as 2-3/10   Pertinent History Rt shoulder scope with open biceps tenodesis on 10/17/2014.    Patient Stated Goals rehab shoulder   Currently in Pain? Yes   Pain Score 2                          [facility] Adult PT Treatment/Exercise - 04/03/15 0001    Bed Mobility   Bed Mobility --  Pt wishes to be called Jose Howell   Posture/Postural Control   Posture/Postural Control  No significant limitations   Exercises   Exercises Shoulder   Shoulder Exercises: Seated   Other Seated Exercises chest press 25# 2 x10, horizontal & vertical grip  increase weight next visit   Shoulder Exercises: Prone   Extension Strengthening;20 reps;Weights   Extension Weight (lbs) 3   Horizontal ABduction 1 Strengthening;Right;20 reps;Weights  short arc and some Assist   Horizontal ABduction 1 Weight (lbs) 3, verbal and tactile cues to reduce substitution   Other Prone Exercises rowing with 5# 2x10   Shoulder Exercises: Sidelying   Flexion Strengthening;20 reps;Weights   Flexion Weight (lbs) 2   ABduction Strengthening;20 reps;Weights   ABduction Weight (lbs) 2   Shoulder Exercises: Standing   Flexion Other (comment)  Cone stacking 1# added to 1st and 2nd shelf, x 35mn   Other Standing Exercises ABDUCTIION stretch Rt UE x 10   Other Standing Exercises Overdoor reach on 8" +4"step 2 x10, challenging to reach, but improved with repetitions   Shoulder Exercises: ROM/Strengthening   UBE (Upper Arm Bike) UBE L 8 x 854m (4/4)    Modalities   Modalities Moist Heat   Moist Heat Therapy   Number Minutes Moist Heat 15 Minutes   Moist Heat Location Shoulder   Manual Therapy   Manual Therapy --   Manual therapy  comments --   Passive ROM --                  PT Short Term Goals - 2015/04/19 1113    PT SHORT TERM GOAL #1   Title be independent in initial HEP   Time 4   Period Weeks   Status Achieved   PT SHORT TERM GOAL #2   Title demonstrate Rt shoulder PROM flexion to > or = to 120 degrees to improve mobility   Time 4   Period Weeks   Status Achieved   PT SHORT TERM GOAL #3   Title report > or = to 50% use of Rt UE for light home tasks   Time 4   Period Weeks   Status Achieved           PT Long Term Goals - 04/19/15 1114    PT LONG TERM GOAL #1   Title be independent in advanced HEP   Time 8   Period Weeks   Status On-going   PT LONG TERM GOAL #2    Title reduce FOTO to < or 39% limitation   Time 8   Period Weeks   Status On-going   PT LONG TERM GOAL #3   Title demonstrate > or = to 120 degrees Rt shoulder AROM to improve overhead reaching   Time 8   Period Weeks   Status On-going   PT LONG TERM GOAL #4   Title demonstrate 4-/5 Rt shoulder strength to improve endurance with use.   Time 8   Period Weeks   Status Achieved   PT LONG TERM GOAL #5   Title demonstrate full Rt shoulder PROM throughout   Time 8   Period Weeks   Status On-going   PT LONG TERM GOAL #6   Title report a 75% reduction in Rt shoulder pain with use.     Time 8   Period Weeks   Status On-going               Plan - 04-19-2015 1111    Clinical Impression Statement Pt with limited Rt shoulder ROM, strength and endurance. Pt will continue to benefit from skilled PT for Rt shoulder strength, AROM and endurance.    Pt will benefit from skilled therapeutic intervention in order to improve on the following deficits Decreased strength;Decreased scar mobility;Impaired flexibility;Pain;Decreased activity tolerance;Decreased range of motion;Increased fascial restricitons   Rehab Potential Good   PT Frequency 2x / week   PT Duration 8 weeks   PT Treatment/Interventions ADLs/Self Care Home Management;Cryotherapy;Electrical Stimulation;Moist Heat;Therapeutic exercise;Therapeutic activities;Ultrasound;Neuromuscular re-education;Patient/family education;Manual techniques;Vasopneumatic Device;Taping;Passive range of motion   PT Next Visit Plan  Continue to improve ROM, strength and endurance.  Renewal this week.    Consulted and Agree with Plan of Care Patient        Problem List Patient Active Problem List   Diagnosis Date Noted  . Routine general medical examination at a health care facility 03/11/2011  . Elevated blood-pressure reading without diagnosis of hypertension 03/11/2011    NAUMANN-HOUEGNIFIO,Shinika Estelle PTA 2015-04-19, 1:25 PM G-codes:  CK goal  status CJ Other PT status D/C PHYSICAL THERAPY DISCHARGE SUMMARY  Visits from Start of Care: 22 Current functional level related to goals / functional outcomes: See above for pt's most recent status.  Pt didn't return after PT on 2015/04/19.  He was not able to be reached.   Remaining deficits: See above. Rt shoulder functional AROM and strength deficits s/p surgery. Pt has  HEP in place.     Education / Equipment: HEP Plan: Patient agrees to discharge.  Patient goals were not met. Patient is being discharged due to meeting the stated rehab goals.  ?????   Sigurd Sos, PT 05/02/2015 10:35 AM   [facility] [address], [place], [place], [address] Phone: [phone]   Fax:  [phone]  Name: Jose Howell MRN: [id number] Date of Birth: March 13, 1950

## 2015-04-05 ENCOUNTER — Ambulatory Visit: Payer: Managed Care, Other (non HMO)

## 2015-04-10 ENCOUNTER — Telehealth: Payer: Self-pay

## 2015-04-10 ENCOUNTER — Ambulatory Visit: Payer: No Typology Code available for payment source

## 2015-04-10 NOTE — Telephone Encounter (Signed)
PT called pt due to missed appt today.  Not able to leave a message.

## 2015-04-12 ENCOUNTER — Telehealth: Payer: Self-pay

## 2015-04-12 ENCOUNTER — Ambulatory Visit: Payer: Non-veteran care | Attending: Orthopaedic Surgery

## 2015-04-12 NOTE — Telephone Encounter (Signed)
PT called pt due to 2nd missed appt this week.  No answer and not able to leave message.  Pt will need renewal next session as current plan of care expired 04/06/15.

## 2017-01-24 ENCOUNTER — Other Ambulatory Visit: Payer: Self-pay

## 2017-01-24 ENCOUNTER — Encounter (HOSPITAL_COMMUNITY): Payer: Self-pay

## 2017-01-24 ENCOUNTER — Emergency Department (HOSPITAL_COMMUNITY)
Admission: EM | Admit: 2017-01-24 | Discharge: 2017-01-24 | Disposition: A | Discharge status: Home / Self Care | Payer: Non-veteran care | Attending: Emergency Medicine | Admitting: Emergency Medicine

## 2017-01-24 DIAGNOSIS — Z79899 Other long term (current) drug therapy: Secondary | ICD-10-CM | POA: Diagnosis not present

## 2017-01-24 DIAGNOSIS — F17210 Nicotine dependence, cigarettes, uncomplicated: Secondary | ICD-10-CM | POA: Insufficient documentation

## 2017-01-24 DIAGNOSIS — Z7982 Long term (current) use of aspirin: Secondary | ICD-10-CM | POA: Diagnosis not present

## 2017-01-24 DIAGNOSIS — R202 Paresthesia of skin: Secondary | ICD-10-CM | POA: Diagnosis present

## 2017-01-24 DIAGNOSIS — Z791 Long term (current) use of non-steroidal anti-inflammatories (NSAID): Secondary | ICD-10-CM | POA: Diagnosis not present

## 2017-01-24 DIAGNOSIS — Z9889 Other specified postprocedural states: Secondary | ICD-10-CM | POA: Diagnosis not present

## 2017-01-24 DIAGNOSIS — G5713 Meralgia paresthetica, bilateral lower limbs: Secondary | ICD-10-CM | POA: Diagnosis not present

## 2017-01-24 LAB — COMPREHENSIVE METABOLIC PANEL
ALBUMIN: 4.6 g/dL (ref 3.5–5.0)
ALT: 22 U/L (ref 17–63)
AST: 25 U/L (ref 15–41)
Alkaline Phosphatase: 57 U/L (ref 38–126)
Anion gap: 8 (ref 5–15)
BILIRUBIN TOTAL: 0.8 mg/dL (ref 0.3–1.2)
BUN: 9 mg/dL (ref 6–20)
CO2: 27 mmol/L (ref 22–32)
Calcium: 8.6 mg/dL — ABNORMAL LOW (ref 8.9–10.3)
Chloride: 105 mmol/L (ref 101–111)
Creatinine, Ser: 0.74 mg/dL (ref 0.61–1.24)
GFR calc non Af Amer: >60 mL/min (ref >60)
Glucose, Bld: 105 mg/dL — ABNORMAL HIGH (ref 65–99)
POTASSIUM: 3.5 mmol/L (ref 3.5–5.1)
Sodium: 140 mmol/L (ref 135–145)
TOTAL PROTEIN: 7.8 g/dL (ref 6.5–8.1)

## 2017-01-24 LAB — CBC
HEMATOCRIT: 42.6 % (ref 39.0–52.0)
Hemoglobin: 14.6 g/dL (ref 13.0–17.0)
MCH: 33.3 pg (ref 26.0–34.0)
MCHC: 34.3 g/dL (ref 30.0–36.0)
MCV: 97 fL (ref 78.0–100.0)
Platelets: 257 10*3/uL (ref 150–400)
RBC: 4.39 MIL/uL (ref 4.22–5.81)
RDW: 15.1 % (ref 11.5–15.5)
WBC: 12.1 10*3/uL — ABNORMAL HIGH (ref 4.0–10.5)

## 2017-01-24 LAB — I-STAT CHEM 8, ED
BUN: 7 mg/dL (ref 6–20)
CALCIUM ION: 1.09 mmol/L — AB (ref 1.15–1.40)
Chloride: 104 mmol/L (ref 101–111)
Creatinine, Ser: 0.7 mg/dL (ref 0.61–1.24)
Glucose, Bld: 103 mg/dL — ABNORMAL HIGH (ref 65–99)
HCT: 46 % (ref 39.0–52.0)
Hemoglobin: 15.6 g/dL (ref 13.0–17.0)
Potassium: 3.6 mmol/L (ref 3.5–5.1)
SODIUM: 143 mmol/L (ref 135–145)
TCO2: 29 mmol/L (ref 22–32)

## 2017-01-24 LAB — MAGNESIUM: Magnesium: 2.3 mg/dL (ref 1.7–2.4)

## 2017-01-24 LAB — PHOSPHORUS: PHOSPHORUS: 3 mg/dL (ref 2.5–4.6)

## 2017-01-24 MED ORDER — HYDROCODONE-ACETAMINOPHEN 5-325 MG PO TABS
1.0000 | ORAL_TABLET | Freq: Once | ORAL | Status: AC
  Administered 2017-01-24: 1 via ORAL
  Filled 2017-01-24: qty 1

## 2017-01-24 NOTE — Discharge Instructions (Signed)
The Neurologist recommends that you get MRI if the symptoms improve. Please return to the [facility] if your symptoms get worse. Otherwise, please see the outpatient neurologist in [place] or at the [facility] if the symptoms are not improving.  Return to the ER immediately if you have leg weakness, inability to urinate or incontinence.

## 2017-01-24 NOTE — ED Triage Notes (Addendum)
Pt reports surgical hernia repair yesterday at the [facility]. He is complaining of pain at the surgery site. He states that last night around 10p he started to feel like his left leg was asleep and now both legs feel like they are asleep and cramping. [facility] and [facility] told him [facility]. A&Ox4. Ambulatory.   Also mentioned that he was bit 90 days ago by some sort of insect on his R leg and wants it looked at. He suggested that he may need a penicillin shot after speaking with a MD friend from [place].

## 2017-01-24 NOTE — Consult Note (Addendum)
Neurology Consultation  Reason for Consult: Bilateral thigh numbness Referring Physician: Dr Rhunette CroftNanavati  CC: Bilateral thigh numbness  History is obtained from: Patient  HPI: Jose MastLuis C Tufaro is a 66 y.o. male no significant past medical history, recent left inguinal hernia repair on Friday, presenting to the emergency room with worsening pain on the site of his surgery on the left inguinal region as well as bilateral thigh numbness that started around 9 PM yesterday. He felt that his thigh regions especially in the upper thigh and both legs are numb to touch and feel heavy.  This started in his left leg and then also involve the right leg. He had the surgery done at the [facility] in [place]. There was no extended stretching of the legs etc. and it was confirmed by the ER attending spoke with the surgeon. Patient denies any prior complaints of similar nature. Has low-grade low back pain. Also has low-grade neck pain. He has a little bit of difficulty walking but that is more because of the pain on the incision site due to the surgery that was done a day or so ago. Denies any bowel bladder incontinence.  Denies any frank weakness in his legs.  Denies any clumsiness with his arms or legs.   ROS: A 14 point ROS was performed and is negative except as noted in the HPI. Only pertinent positive is positive for anxiety  Past Medical History:  Diagnosis Date  . Arthritis   . GERD (gastroesophageal reflux disease)     Family History  Problem Relation Age of Onset  . Cancer Neg Hx   . Diabetes Neg Hx   . Heart disease Neg Hx   . Hyperlipidemia Neg Hx   . Hypertension Neg Hx   . Stroke Neg Hx     Social History:   reports that he has been smoking cigarettes.  He does not have any smokeless tobacco history on file. He reports that he drinks about 0.5 oz of alcohol per week. He reports that he does not use drugs.  Medications No current facility-administered medications for this  encounter.   Current Outpatient Medications:  .  Liniments (BLUE-EMU SUPER STRENGTH) CREA, Apply 1 application topically daily as needed (arm pain)., Disp: , Rfl:  .  Menthol, Topical Analgesic, (ICY HOT EX), Apply 1 application topically daily as needed (arm pain)., Disp: , Rfl:  .  Menthol-Methyl Salicylate (MUSCLE RUB EX), Apply 1 application topically daily as needed (arm pain)., Disp: , Rfl:  .  Multiple Vitamin (MULTIVITAMIN) tablet, Take 1 tablet by mouth daily., Disp: , Rfl:  .  omeprazole (PRILOSEC) 20 MG capsule, Take 20 mg by mouth daily as needed (heartburn)., Disp: , Rfl:  .  oxyCODONE (ROXICODONE) 5 MG immediate release tablet, Take 5 mg by mouth every 6 (six) hours as needed for severe pain., Disp: , Rfl:  .  aspirin 81 MG tablet, Take 81 mg by mouth daily., Disp: , Rfl:  .  CALCIUM PO, Take 1 tablet by mouth daily., Disp: , Rfl:  .  ibuprofen (ADVIL,MOTRIN) 200 MG tablet, Take 200 mg by mouth every 6 (six) hours as needed for mild pain or moderate pain., Disp: , Rfl:  .  Omega-3 Fatty Acids (OMEGA 3 PO), Take 1 capsule by mouth daily., Disp: , Rfl:  .  oxyCODONE (ROXICODONE) 15 MG immediate release tablet, Take 15 mg by mouth every 4 (four) hours as needed for pain., Disp: , Rfl:  .  zolpidem (AMBIEN) 10 MG  tablet, Take 10 mg by mouth at bedtime as needed for sleep., Disp: , Rfl:   Exam: Current vital signs: BP 135/76 (BP Location: Right Arm)   Pulse 90   Resp 18   SpO2 99%  Vital signs in last 24 hours: Pulse Rate:  [86-105] 90 (12/15 1918) Resp:  [17-18] 18 (12/15 1918) BP: (135-145)/(76-96) 135/76 (12/15 1918) SpO2:  [97 %-99 %] 99 % (12/15 1918)  GENERAL: Awake, alert in NAD HEENT: - Normocephalic and atraumatic, dry mm, no LN++, no Thyromegally LUNGS - Clear to auscultation bilaterally with no wheezes CV - S1S2 RRR, no m/r/g, equal pulses bilaterally. ABDOMEN - Soft, nontender, nondistended with normoactive BS Ext: warm, well perfused, intact peripheral pulses,  trace edema in bilateral lower extremities, right leg has a rash on the lower extremity. NEURO:  Mental Status: AA&Ox3  Language: speech is clear.  Naming, repetition, fluency, and comprehension intact. Cranial Nerves: PERRL. EOMI, visual fields full, no facial asymmetry, facial sensation intact, hearing intact, tongue/uvula/soft palate midline, normal sternocleidomastoid and trapezius muscle strength. No evidence of tongue atrophy or fibrillations Motor: Symmetric 5/5 all over with slight limitation of movement in both lower extremities at the hip because of pain Tone: is normal and bulk is normal Sensation-decreased sensation to touch in bilateral anterior lateral thighs proximally Coordination: FTN intact bilaterally, no ataxia in BLE. Gait-slightly antalgic because of pain 2+ DTRs in biceps and brachial radialis, 2+ DTRs in the knees bilaterally, 1+ DTRs in the ankles bilaterally, toes mute bilaterally  Labs  I have reviewed labs in epic and the results pertinent to this consultation are:  CBC    Component Value Date/Time   WBC 12.1 (H) 01/24/2017 1814   RBC 4.39 01/24/2017 1814   HGB 15.6 01/24/2017 1826   HCT 46.0 01/24/2017 1826   PLT 257 01/24/2017 1814   MCV 97.0 01/24/2017 1814   MCH 33.3 01/24/2017 1814   MCHC 34.3 01/24/2017 1814   RDW 15.1 01/24/2017 1814   LYMPHSABS 1.7 01/29/2012 1526   MONOABS 0.4 01/29/2012 1526   EOSABS 0.0 01/29/2012 1526   BASOSABS 0.0 01/29/2012 1526    CMP     Component Value Date/Time   NA 143 01/24/2017 1826   K 3.6 01/24/2017 1826   CL 104 01/24/2017 1826   CO2 27 01/24/2017 1814   GLUCOSE 103 (H) 01/24/2017 1826   BUN 7 01/24/2017 1826   CREATININE 0.70 01/24/2017 1826   CREATININE 0.86 04/16/2011 1106   CALCIUM 8.6 (L) 01/24/2017 1814   PROT 7.8 01/24/2017 1814   ALBUMIN 4.6 01/24/2017 1814   AST 25 01/24/2017 1814   ALT 22 01/24/2017 1814   ALKPHOS 57 01/24/2017 1814   BILITOT 0.8 01/24/2017 1814   GFRNONAA >60 01/24/2017  1814   GFRAA >60 01/24/2017 1814   Imaging No pertinent imaging to review at this time  Assessment:  66 year old man with past history of recent left inguinal hernia repair on Friday presenting to the emergency room with worsening pain at the site of the surgery as well as bilateral thigh numbness that started around 9 PM yesterday in his left upper thigh and then also involves the right upper thigh. On my examination, there is only mild sensory loss in bilateral anterior lateral proximal thighs. This is most likely reflective of meralgia paresthetica like pathology because of the involvement of the lateral femoral cutaneous nerve. Because of his history of back pain I think lumbar imaging would be prudent. We discussed MRI of the lumbar  spine and also of the C-spine because of pain in his neck, and he wants to get it done over at the [facility] where his most of the medical care is.  I do not see an urgent need to get this done as there is no bowel bladder incontinence or signs of myelopathy on his exam Labs are essentially unremarkable with some leukocytosis, which is probably reactive from his recent surgery  Impression: Bilateral thigh numbness/paresthesias-evaluate for meralgia paresthetica Evaluate for lumbar radicular radiculopathy and cervical radiculopathy-as outpatient  Recommendations: I would recommend obtaining an EMG nerve conduction studies as an outpatient I would also recommend obtaining a lumbar spine MRI and C-spine MRI as an outpatient.  He would like to get it done at the [facility]. I have recommended that he return to [facility] if his symptoms worsen or he sees no improvement and would like to seek immediate attention. I also advised him to return immediately back to the hospital if he has any bowel or bladder incontinence or retention. I did discuss with him the option of transferring to [facility] for an MRI, but [facility] would [facility] to [facility] with [facility] to make sure that  it is covered from a reimbursement perspective.  As I said above, I do not see an urgent need for this imaging and this can be done as an outpatient either at the [facility] or [facility]. Also, because of his prior experience with the MRI requiring sedation, he would like to pursue this at [facility] unless emergently indicated.  -- Milon DikesAshish Shaquina Gillham, MD [facility] [phone] If 7pm to 7am, please call on call as listed on AMION.

## 2017-01-24 NOTE — ED Notes (Signed)
Bed: WA02 Expected date:  Expected time:  Means of arrival:  Comments: 

## 2017-01-26 NOTE — ED Provider Notes (Signed)
[facility]-EMERGENCY DEPT Provider Note   CSN: [id number] Arrival date & time: 01/24/17  1330     History   Chief Complaint Chief Complaint  Patient presents with  . Post-op Problem  . Leg Pain    HPI Jose Howell is a 66 y.o. male.  HPI Pt with hx of GERD, L sided inguinal hernia s/p laparoscopic hernia repair pod #1 at the [place] comes in with cc of leg numbness. PT reports that he got home in the afternoon after surgery. He was drowsy and doesn't recall if he had numbness in his legs at the time of d/c, but at 9 pm he started noticing numbness in both of his legs with some heaviness. Pt has been ambulating ok and doesn't feel any weakness in the legs. Pt denies any back pain, and pt has no associated urinary incontinence, urinary retention, bowel incontinence, pins and needle sensation in the perineal area. Pt has no known hx of allergies.   Past Medical History:  Diagnosis Date  . Arthritis   . GERD (gastroesophageal reflux disease)     Patient Active Problem List   Diagnosis Date Noted  . Routine general medical examination at a health care facility 03/11/2011  . Elevated blood-pressure reading without diagnosis of hypertension 03/11/2011    Past Surgical History:  Procedure Laterality Date  . RADIOLOGY WITH ANESTHESIA Right 10/17/2014   Procedure: MRI RIGHT SHOULDER WITHOUT;  Surgeon: Medication Radiologist, MD;  Location: [facility] OR;  Service: Radiology;  Laterality: Right;  DR. DYER/MRI  . UMBILICAL HERNIA REPAIR         Home Medications    Prior to Admission medications   Medication Sig Start Date End Date Taking? Authorizing Provider  Liniments (BLUE-EMU SUPER STRENGTH) CREA Apply 1 application topically daily as needed (arm pain).   Yes [provider]  Menthol, Topical Analgesic, (ICY HOT EX) Apply 1 application topically daily as needed (arm pain).   Yes [provider]  Menthol-Methyl Salicylate (MUSCLE RUB EX) Apply 1  application topically daily as needed (arm pain).   Yes [provider]  Multiple Vitamin (MULTIVITAMIN) tablet Take 1 tablet by mouth daily.   Yes [provider]  omeprazole (PRILOSEC) 20 MG capsule Take 20 mg by mouth daily as needed (heartburn).   Yes [provider]  oxyCODONE (ROXICODONE) 5 MG immediate release tablet Take 5 mg by mouth every 6 (six) hours as needed for severe pain.   Yes [provider]  aspirin 81 MG tablet Take 81 mg by mouth daily.    [provider]  CALCIUM PO Take 1 tablet by mouth daily.    [provider]  ibuprofen (ADVIL,MOTRIN) 200 MG tablet Take 200 mg by mouth every 6 (six) hours as needed for mild pain or moderate pain.    [provider]  Omega-3 Fatty Acids (OMEGA 3 PO) Take 1 capsule by mouth daily.    [provider]  oxyCODONE (ROXICODONE) 15 MG immediate release tablet Take 15 mg by mouth every 4 (four) hours as needed for pain.    [provider]  zolpidem (AMBIEN) 10 MG tablet Take 10 mg by mouth at bedtime as needed for sleep.    [provider]    Family History Family History  Problem Relation Age of Onset  . Cancer Neg Hx   . Diabetes Neg Hx   . Heart disease Neg Hx   . Hyperlipidemia Neg Hx   . Hypertension Neg  Hx   . Stroke Neg Hx     Social History Social History   Tobacco Use  . Smoking status: Current Some Day Smoker    Types: Cigarettes  Substance Use Topics  . Alcohol use: Yes    Alcohol/week: 0.5 oz    Types: 1 Standard drinks or equivalent per week  . Drug use: No     Allergies   Pollen extract   Review of Systems Review of Systems  Constitutional: Positive for activity change.  Musculoskeletal: Negative for back pain.  Skin: Negative for rash.  Neurological: Positive for numbness. Negative for weakness.  All other systems reviewed and are negative.    Physical Exam Updated Vital Signs BP 125/68 (BP Location: Right  Arm)   Pulse 75   Temp 98.2 F (36.8 C) (Oral)   Resp 16   Ht 5\' 5"  (1.651 m)   Wt 69.9 kg (154 lb)   SpO2 100%   BMI 25.63 kg/m   Physical Exam  Constitutional: He is oriented to person, place, and time. He appears well-developed.  HENT:  Head: Atraumatic.  Neck: Neck supple.  Cardiovascular: Normal rate and intact distal pulses.  Pulmonary/Chest: Effort normal.  Musculoskeletal: He exhibits no edema.  Pt has tenderness over the lumbar region No step offs, no erythema. Pt has 2+ patellar reflex bilaterally. Able to discriminate between sharp and dull. Able to ambulate   Neurological: He is alert and oriented to person, place, and time.  Skin: Skin is warm. No rash noted.  Surgical wound site looks clean  Nursing note and vitals reviewed.    ED Treatments / Results  Labs (all labs ordered are listed, but only abnormal results are displayed) Labs Reviewed  CBC - Abnormal; Notable for the following components:      Result Value   WBC 12.1 (*)    All other components within normal limits  COMPREHENSIVE METABOLIC PANEL - Abnormal; Notable for the following components:   Glucose, Bld 105 (*)    Calcium 8.6 (*)    All other components within normal limits  I-STAT CHEM 8, ED - Abnormal; Notable for the following components:   Glucose, Bld 103 (*)    Calcium, Ion 1.09 (*)    All other components within normal limits  MAGNESIUM  PHOSPHORUS    EKG  EKG Interpretation None       Radiology No results found.  Procedures Procedures (including critical care time)  Medications Ordered in ED Medications  HYDROcodone-acetaminophen (NORCO/VICODIN) 5-325 MG per tablet 1 tablet (1 tablet Oral Given 01/24/17 2134)     Initial Impression / Assessment and Plan / ED Course  I have reviewed the triage vital signs and the nursing notes.  Pertinent labs & imaging results that were available during my care of the patient were reviewed by me and considered in my medical  decision making (see chart for details).    Pt comes in with cc of numbness in legs. Pt has no back pain, he is pod # 1 from surgery where he needed general anesthesia.  Pt has no focal spine tenderness, and there is no red flags on hx or exam supporting cord compression and the pulses are intact thus I dont think pt has PVAD. My suspicion is e'lyte abn, anesthesia side effects, neuropathy from possible neuro-toxic side effects of meds, nerve impingement syndrome based on positioning.  [facility]. He informed me that the procedure was  3  hours long and pt was not placed in trendelenburg. We consulted Neuro, they recommended MRI to ensure there is no cord / nerve root injury, but pt preferred getting checked at the [facility] due to billing reasons. Pt understands that we will not have ruled out cord issues based on our eval. Surgeon will see pt at 10 am on Tuesday - and pt made aware of that.   Strict ER return precautions have been discussed, and patient is agreeing with the plan and is comfortable with the workup done and the recommendations from the ER.   Final Clinical Impressions(s) / ED Diagnoses   Final diagnoses:  Paresthesia of bilateral legs    ED Discharge Orders    None       Derwood KaplanNanavati, Salvator Seppala, MD 01/26/17 1642

## 2017-06-08 ENCOUNTER — Emergency Department (HOSPITAL_COMMUNITY): Payer: Non-veteran care

## 2017-06-08 ENCOUNTER — Other Ambulatory Visit: Payer: Self-pay

## 2017-06-08 ENCOUNTER — Encounter (HOSPITAL_COMMUNITY): Payer: Self-pay

## 2017-06-08 ENCOUNTER — Emergency Department (HOSPITAL_COMMUNITY)
Admission: EM | Admit: 2017-06-08 | Discharge: 2017-06-08 | Disposition: A | Discharge status: Home / Self Care | Payer: Non-veteran care | Attending: Emergency Medicine | Admitting: Emergency Medicine

## 2017-06-08 DIAGNOSIS — M549 Dorsalgia, unspecified: Secondary | ICD-10-CM | POA: Insufficient documentation

## 2017-06-08 DIAGNOSIS — M542 Cervicalgia: Secondary | ICD-10-CM | POA: Insufficient documentation

## 2017-06-08 DIAGNOSIS — Z7982 Long term (current) use of aspirin: Secondary | ICD-10-CM | POA: Insufficient documentation

## 2017-06-08 DIAGNOSIS — F17210 Nicotine dependence, cigarettes, uncomplicated: Secondary | ICD-10-CM | POA: Insufficient documentation

## 2017-06-08 MED ORDER — METHOCARBAMOL 500 MG PO TABS: 500.0000 mg | ORAL_TABLET | Freq: Two times a day (BID) | ORAL | 0 refills | Status: AC

## 2017-06-08 MED ORDER — METHOCARBAMOL 500 MG PO TABS: 500.0000 mg | ORAL_TABLET | Freq: Once | ORAL | Status: DC
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
## 2017-07-13 ENCOUNTER — Telehealth (INDEPENDENT_AMBULATORY_CARE_PROVIDER_SITE_OTHER): Payer: Self-pay | Admitting: Orthopaedic Surgery

## 2017-07-13 NOTE — Telephone Encounter (Signed)
Please advise 

## 2017-07-13 NOTE — Telephone Encounter (Signed)
noted 

## 2017-07-13 NOTE — Telephone Encounter (Signed)
I called. He will call Dr[facility] Ovidio Kinavid Newman once [facility] get [facility]t. I gave him info. Phone # and address. FYI

## 2017-07-13 NOTE — Telephone Encounter (Signed)
Patient is wondering if Dr. Ophelia CharterYates can give him a call, he said Dr. Ophelia CharterYates has done shoulder surgery on him and that he would like to ask him about another surgery coming up to see if he can refer him to anyone. CB #  [phone]
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
